# Patient Record
Sex: Male | Born: 1980 | Race: White | Hispanic: No | Marital: Single | Smoking: Former smoker
Health system: Southern US, Community
[De-identification: ages and names within clinical notes are randomized; demographics above are authoritative.]

## PROBLEM LIST (undated history)

## (undated) DIAGNOSIS — F909 Attention-deficit hyperactivity disorder, unspecified type: Secondary | ICD-10-CM

---
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
## 2002-09-09 ENCOUNTER — Emergency Department (HOSPITAL_COMMUNITY): Admission: AC | Admit: 2002-09-09 | Discharge: 2002-09-10 | Payer: Self-pay | Admitting: Emergency Medicine

## 2002-09-10 ENCOUNTER — Encounter: Payer: Self-pay | Admitting: Emergency Medicine

## 2004-02-17 ENCOUNTER — Encounter (INDEPENDENT_AMBULATORY_CARE_PROVIDER_SITE_OTHER): Payer: Self-pay | Admitting: *Deleted

## 2004-02-17 ENCOUNTER — Ambulatory Visit (HOSPITAL_COMMUNITY): Admission: RE | Admit: 2004-02-17 | Discharge: 2004-02-17 | Payer: Self-pay | Admitting: Surgery

## 2004-02-17 ENCOUNTER — Ambulatory Visit (HOSPITAL_BASED_OUTPATIENT_CLINIC_OR_DEPARTMENT_OTHER): Admission: RE | Admit: 2004-02-17 | Discharge: 2004-02-17 | Payer: Self-pay | Admitting: Surgery

## 2007-02-24 ENCOUNTER — Emergency Department (HOSPITAL_COMMUNITY): Admission: EM | Admit: 2007-02-24 | Discharge: 2007-02-24 | Payer: Self-pay | Admitting: *Deleted

## 2007-03-01 ENCOUNTER — Emergency Department (HOSPITAL_COMMUNITY): Admission: EM | Admit: 2007-03-01 | Discharge: 2007-03-01 | Payer: Self-pay | Admitting: Emergency Medicine

## 2011-10-21 ENCOUNTER — Emergency Department (HOSPITAL_COMMUNITY)
Admission: EM | Admit: 2011-10-21 | Discharge: 2011-10-21 | Disposition: A | Discharge status: Home / Self Care | Payer: Self-pay | Attending: Emergency Medicine | Admitting: Emergency Medicine

## 2011-10-21 ENCOUNTER — Encounter (HOSPITAL_COMMUNITY): Payer: Self-pay | Admitting: Family Medicine

## 2011-10-21 DIAGNOSIS — W278XXA Contact with other nonpowered hand tool, initial encounter: Secondary | ICD-10-CM | POA: Insufficient documentation

## 2011-10-21 DIAGNOSIS — S61209A Unspecified open wound of unspecified finger without damage to nail, initial encounter: Secondary | ICD-10-CM | POA: Insufficient documentation

## 2011-10-21 NOTE — ED Notes (Addendum)
Per pt sts was helping his friend out doing [employer] and cut finger on razor blade. Pt had about 1/2 inch left hand middle finger. Bleeding controlled.

## 2012-02-20 ENCOUNTER — Emergency Department (HOSPITAL_BASED_OUTPATIENT_CLINIC_OR_DEPARTMENT_OTHER)
Admission: EM | Admit: 2012-02-20 | Discharge: 2012-02-20 | Disposition: A | Discharge status: Home / Self Care | Payer: Self-pay | Attending: Emergency Medicine | Admitting: Emergency Medicine

## 2012-02-20 ENCOUNTER — Emergency Department (HOSPITAL_BASED_OUTPATIENT_CLINIC_OR_DEPARTMENT_OTHER): Payer: Self-pay

## 2012-02-20 ENCOUNTER — Encounter (HOSPITAL_BASED_OUTPATIENT_CLINIC_OR_DEPARTMENT_OTHER): Payer: Self-pay | Admitting: *Deleted

## 2012-02-20 DIAGNOSIS — F17200 Nicotine dependence, unspecified, uncomplicated: Secondary | ICD-10-CM | POA: Insufficient documentation

## 2012-02-20 DIAGNOSIS — K4090 Unilateral inguinal hernia, without obstruction or gangrene, not specified as recurrent: Secondary | ICD-10-CM | POA: Insufficient documentation

## 2012-02-20 DIAGNOSIS — Z79899 Other long term (current) drug therapy: Secondary | ICD-10-CM | POA: Insufficient documentation

## 2012-02-20 DIAGNOSIS — K469 Unspecified abdominal hernia without obstruction or gangrene: Secondary | ICD-10-CM

## 2012-02-20 LAB — URINALYSIS, ROUTINE W REFLEX MICROSCOPIC
Glucose, UA: NEGATIVE mg/dL
Hgb urine dipstick: NEGATIVE
Leukocytes, UA: NEGATIVE
Protein, ur: NEGATIVE mg/dL
Specific Gravity, Urine: 1.029 (ref 1.005–1.030)

## 2012-02-20 NOTE — ED Notes (Signed)
Patient transported to X-ray 

## 2012-02-20 NOTE — ED Provider Notes (Signed)
Medical screening examination/treatment/procedure(s) were performed by non-physician practitioner and as supervising physician I was immediately available for consultation/collaboration.   Rolan Bucco, MD 02/20/12 [phone]

## 2012-02-20 NOTE — ED Notes (Signed)
Pain in right groin area onset Saturday while he was jogging no urinary signs or symptoms bothers him more when standing or walking

## 2012-02-20 NOTE — ED Provider Notes (Signed)
History     CSN: [id number]  Arrival date & time 02/20/12  1215   First MD Initiated Contact with Patient 02/20/12 1226      Chief Complaint  Patient presents with  . Groin Pain    (Consider location/radiation/quality/duration/timing/severity/associated sxs/prior treatment) HPI Comments: Pt states that he developed right sided groin pain without radiation to the testicle, back or abdomen:pt states that the discomfort is worse with standing or exercising:pt denies dysuria or discharge:no n/v/d,fever or  The history is provided by the patient. No language interpreter was used.    History reviewed. No pertinent past medical history.  History reviewed. No pertinent past surgical history.  History reviewed. No pertinent family history.  History  Substance Use Topics  . Smoking status: Current Every Day Smoker  . Smokeless tobacco: Not on file  . Alcohol Use: Yes     Comment: occ      Review of Systems  Constitutional: Negative.   Respiratory: Negative.   Cardiovascular: Negative.     Allergies  Review of patient's allergies indicates no known allergies.  Home Medications   Current Outpatient Rx  Name  Route  Sig  Dispense  Refill  . AMPHETAMINE-DEXTROAMPHETAMINE 30 MG PO TABS   Oral   Take 30 mg by mouth 2 (two) times daily.         . IBUPROFEN 200 MG PO TABS   Oral   Take 200 mg by mouth every 6 (six) hours as needed. For pain         . LORAZEPAM 1 MG PO TABS   Oral   Take 1 mg by mouth at bedtime.           BP 123/72  Pulse 82  Temp 98.2 F (36.8 C) (Oral)  Resp 20  Ht 6\' 1"  (1.854 m)  Wt 200 lb (90.719 kg)  BMI 26.39 kg/m2  SpO2 99%  Physical Exam  Nursing note and vitals reviewed. Constitutional: He is oriented to person, place, and time. He appears well-developed and well-nourished.  HENT:  Head: Normocephalic and atraumatic.  Eyes: Conjunctivae normal and EOM are normal.  Neck: Neck supple.  Cardiovascular: Normal rate and regular  rhythm.   Pulmonary/Chest: Effort normal and breath sounds normal.  Abdominal: Soft. Bowel sounds are normal. There is no tenderness. A hernia is present. Hernia confirmed positive in the right inguinal area.  Genitourinary: Testes normal and penis normal. No penile tenderness.  Musculoskeletal: Normal range of motion.  Neurological: He is alert and oriented to person, place, and time.  Skin: Skin is warm and dry.  Psychiatric: He has a normal mood and affect.    ED Course  Procedures (including critical care time)  Labs Reviewed  URINALYSIS, ROUTINE W REFLEX MICROSCOPIC - Abnormal; Notable for the following:    Color, Urine AMBER (*)  BIOCHEMICALS MAY BE AFFECTED BY COLOR   All other components within normal limits   Dg Abd 1 View  02/20/2012  *RADIOLOGY REPORT*  Clinical Data: Right groin pain.  ABDOMEN - 1 VIEW  Comparison: None  Findings: Rounded calcification in the left side of the anatomic pelvis, likely small phlebolith.  No suspicious calcifications. Nonobstructive bowel gas pattern.  No supine evidence of free air. No organomegaly or acute bony abnormality.  IMPRESSION: No acute findings.   Original Report Authenticated By: Charlett Nose, M.D.      1. Hernia       MDM  Pt has a reducible hernia:will refer to surgery  Teressa Lower, NP 02/20/12 1321

## 2013-06-28 ENCOUNTER — Encounter (HOSPITAL_COMMUNITY): Payer: Self-pay | Admitting: Emergency Medicine

## 2013-06-28 ENCOUNTER — Emergency Department (HOSPITAL_COMMUNITY)
Admission: EM | Admit: 2013-06-28 | Discharge: 2013-06-29 | Disposition: A | Discharge status: Home / Self Care | Payer: Self-pay | Attending: Emergency Medicine | Admitting: Emergency Medicine

## 2013-06-28 DIAGNOSIS — T401X4A Poisoning by heroin, undetermined, initial encounter: Secondary | ICD-10-CM | POA: Insufficient documentation

## 2013-06-28 DIAGNOSIS — F1210 Cannabis abuse, uncomplicated: Secondary | ICD-10-CM | POA: Insufficient documentation

## 2013-06-28 DIAGNOSIS — Z79899 Other long term (current) drug therapy: Secondary | ICD-10-CM | POA: Insufficient documentation

## 2013-06-28 DIAGNOSIS — F17200 Nicotine dependence, unspecified, uncomplicated: Secondary | ICD-10-CM | POA: Insufficient documentation

## 2013-06-28 DIAGNOSIS — T401X1A Poisoning by heroin, accidental (unintentional), initial encounter: Secondary | ICD-10-CM | POA: Insufficient documentation

## 2013-06-28 DIAGNOSIS — T50901A Poisoning by unspecified drugs, medicaments and biological substances, accidental (unintentional), initial encounter: Secondary | ICD-10-CM

## 2013-06-28 DIAGNOSIS — F1110 Opioid abuse, uncomplicated: Secondary | ICD-10-CM | POA: Insufficient documentation

## 2013-06-28 DIAGNOSIS — Y929 Unspecified place or not applicable: Secondary | ICD-10-CM | POA: Insufficient documentation

## 2013-06-28 DIAGNOSIS — Y939 Activity, unspecified: Secondary | ICD-10-CM | POA: Insufficient documentation

## 2013-06-28 LAB — COMPREHENSIVE METABOLIC PANEL
ALBUMIN: 3.5 g/dL (ref 3.5–5.2)
ALT: 50 U/L (ref 0–53)
AST: 47 U/L — ABNORMAL HIGH (ref 0–37)
Alkaline Phosphatase: 74 U/L (ref 39–117)
BUN: 15 mg/dL (ref 6–23)
CALCIUM: 8.6 mg/dL (ref 8.4–10.5)
CO2: 25 mEq/L (ref 19–32)
CREATININE: 0.86 mg/dL (ref 0.50–1.35)
Chloride: 101 mEq/L (ref 96–112)
GFR calc non Af Amer: >90 mL/min (ref >90)
Glucose, Bld: 84 mg/dL (ref 70–99)
POTASSIUM: 3.9 meq/L (ref 3.7–5.3)
Sodium: 138 mEq/L (ref 137–147)
TOTAL PROTEIN: 6.4 g/dL (ref 6.0–8.3)
Total Bilirubin: 0.6 mg/dL (ref 0.3–1.2)

## 2013-06-28 LAB — CBC WITH DIFFERENTIAL/PLATELET
BASOS PCT: 0 % (ref 0–1)
Basophils Absolute: 0 10*3/uL (ref 0.0–0.1)
EOS ABS: 0.1 10*3/uL (ref 0.0–0.7)
EOS PCT: 1 % (ref 0–5)
HCT: 38.4 % — ABNORMAL LOW (ref 39.0–52.0)
HEMOGLOBIN: 12.9 g/dL — AB (ref 13.0–17.0)
Lymphocytes Relative: 12 % (ref 12–46)
Lymphs Abs: 0.7 10*3/uL (ref 0.7–4.0)
MCH: 28.4 pg (ref 26.0–34.0)
MCHC: 33.6 g/dL (ref 30.0–36.0)
MCV: 84.6 fL (ref 78.0–100.0)
MONOS PCT: 13 % — AB (ref 3–12)
Monocytes Absolute: 0.7 10*3/uL (ref 0.1–1.0)
NEUTROS PCT: 74 % (ref 43–77)
Neutro Abs: 4.4 10*3/uL (ref 1.7–7.7)
Platelets: 149 10*3/uL — ABNORMAL LOW (ref 150–400)
RBC: 4.54 MIL/uL (ref 4.22–5.81)
RDW: 13.8 % (ref 11.5–15.5)
WBC: 5.9 10*3/uL (ref 4.0–10.5)

## 2013-06-28 LAB — RAPID URINE DRUG SCREEN, HOSP PERFORMED
AMPHETAMINES: NOT DETECTED
BENZODIAZEPINES: NOT DETECTED
Barbiturates: NOT DETECTED
COCAINE: NOT DETECTED
OPIATES: POSITIVE — AB
TETRAHYDROCANNABINOL: POSITIVE — AB

## 2013-06-28 NOTE — ED Notes (Addendum)
Per EMS: Pt was found unresponsive by his dad. Patient was found with empty bags that did contain heroin, pt currently admits to use of heroin. EMS established a nasal trumpet and bagged patient. 2 mg of narcan given intranasally and 2 mg of narcan given IV. Nasal trumpet was removed before the arrival of the transport team. Pt is now A/O and maintaining oxygen saturations that are WDL. Pt denies SI/HI. Pt reports using marijuana last week, however denies other drug or alcohol use.

## 2013-06-28 NOTE — ED Notes (Signed)
Pt is calling his sister to come pick him up.

## 2013-06-28 NOTE — ED Provider Notes (Signed)
CSN: [id number]     Arrival date & time 06/28/13  2045 History   First MD Initiated Contact with Patient 06/28/13 2052     Chief Complaint  Patient presents with  . Drug Overdose     (Consider location/radiation/quality/duration/timing/severity/associated sxs/prior Treatment) Patient is a 33 y.o. male presenting with Overdose. The history is provided by the patient and the EMS personnel (the pt took heroin iv and was found by father unresponsive).  Drug Overdose This is a new problem. The current episode started 1 to 2 hours ago. The problem occurs rarely. The problem has been resolved. Pertinent negatives include no chest pain, no abdominal pain and no headaches. Nothing aggravates the symptoms. Relieved by: narcan.    History reviewed. No pertinent past medical history. History reviewed. No pertinent past surgical history. No family history on file. History  Substance Use Topics  . Smoking status: Current Every Day Smoker  . Smokeless tobacco: Not on file  . Alcohol Use: Yes     Comment: occ    Review of Systems  Constitutional: Negative for appetite change and fatigue.  HENT: Negative for congestion, ear discharge and sinus pressure.   Eyes: Negative for discharge.  Respiratory: Negative for cough.   Cardiovascular: Negative for chest pain.  Gastrointestinal: Negative for abdominal pain and diarrhea.  Genitourinary: Negative for frequency and hematuria.  Musculoskeletal: Negative for back pain.  Skin: Negative for rash.  Neurological: Negative for seizures and headaches.  Psychiatric/Behavioral: Negative for hallucinations.      Allergies  Review of patient's allergies indicates no known allergies.  Home Medications   Prior to Admission medications   Medication Sig Start Date End Date Taking? Authorizing Provider  amphetamine-dextroamphetamine (ADDERALL) 30 MG tablet Take 30 mg by mouth 2 (two) times daily.   Yes Historical Provider, MD  clonazePAM (KLONOPIN) 1 MG  tablet Take 1 mg by mouth at bedtime.   Yes Historical Provider, MD  ibuprofen (ADVIL,MOTRIN) 200 MG tablet Take 200 mg by mouth every 6 (six) hours as needed. For pain   Yes Historical Provider, MD   BP 124/64  Pulse 70  Temp(Src) 97.9 F (36.6 C) (Oral)  Resp 16  SpO2 98% Physical Exam  Constitutional: He is oriented to person, place, and time. He appears well-developed.  HENT:  Head: Normocephalic.  Eyes: Conjunctivae and EOM are normal. No scleral icterus.  Neck: Neck supple. No thyromegaly present.  Cardiovascular: Normal rate and regular rhythm.  Exam reveals no gallop and no friction rub.   No murmur heard. Pulmonary/Chest: No stridor. He has no wheezes. He has no rales. He exhibits no tenderness.  Abdominal: He exhibits no distension. There is no tenderness. There is no rebound.  Musculoskeletal: Normal range of motion. He exhibits no edema.  Lymphadenopathy:    He has no cervical adenopathy.  Neurological: He is oriented to person, place, and time. He exhibits normal muscle tone. Coordination normal.  Skin: No rash noted. No erythema.  Psychiatric: He has a normal mood and affect. His behavior is normal.    ED Course  Procedures (including critical care time) Labs Review Labs Reviewed  CBC WITH DIFFERENTIAL - Abnormal; Notable for the following:    Hemoglobin 12.9 (*)    HCT 38.4 (*)    Platelets 149 (*)    Monocytes Relative 13 (*)    All other components within normal limits  COMPREHENSIVE METABOLIC PANEL - Abnormal; Notable for the following:    AST 47 (*)    All other  components within normal limits  URINE RAPID DRUG SCREEN (HOSP PERFORMED) - Abnormal; Notable for the following:    Opiates POSITIVE (*)    Tetrahydrocannabinol POSITIVE (*)    All other components within normal limits    Imaging Review No results found.   EKG Interpretation None      MDM   Final diagnoses:  Drug overdose    Pt refused in pt tx.  He is not suicidal.  He states he  will follow up with out pt tx this week.   Benny Lennert, MD 06/28/13 2252

## 2013-06-28 NOTE — Discharge Instructions (Signed)
Follow up this week with out pt treatment as you had planned.

## 2013-06-29 NOTE — ED Notes (Signed)
Resources for substance abuse facilities given to pt

## 2013-12-01 ENCOUNTER — Encounter (HOSPITAL_COMMUNITY): Payer: Self-pay | Admitting: Emergency Medicine

## 2013-12-01 ENCOUNTER — Emergency Department (HOSPITAL_COMMUNITY)
Admission: EM | Admit: 2013-12-01 | Discharge: 2013-12-01 | Disposition: A | Discharge status: Home / Self Care | Payer: Self-pay | Attending: Emergency Medicine | Admitting: Emergency Medicine

## 2013-12-01 DIAGNOSIS — F1110 Opioid abuse, uncomplicated: Secondary | ICD-10-CM | POA: Insufficient documentation

## 2013-12-01 DIAGNOSIS — Z79899 Other long term (current) drug therapy: Secondary | ICD-10-CM | POA: Insufficient documentation

## 2013-12-01 DIAGNOSIS — F1910 Other psychoactive substance abuse, uncomplicated: Secondary | ICD-10-CM

## 2013-12-01 DIAGNOSIS — Z720 Tobacco use: Secondary | ICD-10-CM | POA: Insufficient documentation

## 2013-12-01 DIAGNOSIS — F909 Attention-deficit hyperactivity disorder, unspecified type: Secondary | ICD-10-CM | POA: Insufficient documentation

## 2013-12-01 HISTORY — DX: Attention-deficit hyperactivity disorder, unspecified type: F90.9

## 2013-12-01 NOTE — ED Notes (Signed)
PT states last used heroin on Saturday. Went to [facility] on Monday for detox. Was discharged last night and needs medical clearance and help getting into [facility]. Denies withdrawal symptoms except anxiety. A&Ox3; denies HI or SI.

## 2013-12-01 NOTE — ED Notes (Signed)
Pt states he would like to get into [facility] to be treated for opiate abuse.  Pt was just discharged from [facility] for opiate detox.  Denies SI or HI ideation.

## 2013-12-01 NOTE — Discharge Instructions (Signed)
[facility]

## 2013-12-01 NOTE — ED Provider Notes (Signed)
CSN: [id number]     Arrival date & time 12/01/13  [id number] History   First MD Initiated Contact with Patient 12/01/13 0740     Chief Complaint  Patient presents with  . Medical Clearance     (Consider location/radiation/quality/duration/timing/severity/associated sxs/prior Treatment) HPI [facility]. 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 He last used heroin and marijuana 9 days ago. No further drug use since. He is presently asymptomatic. Past Medical History  Diagnosis Date  . ADHD (attention deficit hyperactivity disorder)    History reviewed. No pertinent past surgical history. No family history on file. History  Substance Use Topics  . Smoking status: Current Every Day Smoker -- 1.00 packs/day    Types: Cigarettes  . Smokeless tobacco: Not on file  . Alcohol Use: Yes     Comment: occ   denies recent alcohol use  IV drug use last time 9 days ago Review of Systems  Constitutional: Negative.   HENT: Negative.   Respiratory: Negative.   Cardiovascular: Negative.   Gastrointestinal: Negative.   Musculoskeletal: Negative.   Skin: Negative.   Neurological: Negative.   Psychiatric/Behavioral: Negative.   All other systems reviewed and are negative.     Allergies  Review of patient's allergies indicates no known allergies.  Home Medications   Prior to Admission medications   Medication Sig Start Date End Date Taking? Authorizing Provider  amphetamine-dextroamphetamine (ADDERALL) 30 MG tablet Take 30 mg by mouth 2 (two) times daily.   Yes Historical Provider, MD   BP 122/81  Pulse 80  Temp(Src) 97.5 F (36.4 C) (Oral)  Resp 20  Ht 6\' 1"  (1.854 m)  Wt 180 lb (81.647 kg)  BMI 23.75 kg/m2  SpO2 97% Physical Exam  Nursing note and vitals reviewed. Constitutional: He is oriented to person, place, and time. He appears well-developed and well-nourished.   HENT:  Head: Normocephalic and atraumatic.  Eyes: Conjunctivae are normal. Pupils are equal, round, and reactive to light.  Neck: Neck supple. No tracheal deviation present. No thyromegaly present.  Cardiovascular: Normal rate and regular rhythm.   No murmur heard. Pulmonary/Chest: Effort normal and breath sounds normal.  Abdominal: Soft. Bowel sounds are normal. He exhibits no distension. There is no tenderness.  Musculoskeletal: Normal range of motion. He exhibits no edema and no tenderness.  Neurological: He is alert and oriented to person, place, and time. No cranial nerve deficit. Coordination normal.  Gait normal and steady  Skin: Skin is warm and dry. No rash noted.  Psychiatric: He has a normal mood and affect.    ED Course  Procedures (including critical care time) Labs Review Labs Reviewed - No data to display  Imaging Review No results found.   EKG Interpretation None      MDM  I spoke with Arnoldo MoraleNancy Ratlidge,, [facility]. No further ED evaluation needed. Patient can present directly to [facility] upon leaving here for evaluation for drug treatment Final diagnoses:  None        Doug SouSam Haden Suder, MD 12/01/13 [phone]

## 2014-08-14 ENCOUNTER — Emergency Department (HOSPITAL_COMMUNITY): Payer: Self-pay

## 2014-08-14 ENCOUNTER — Emergency Department (HOSPITAL_COMMUNITY)
Admission: EM | Admit: 2014-08-14 | Discharge: 2014-08-15 | Disposition: A | Discharge status: Home / Self Care | Payer: Self-pay | Attending: Emergency Medicine | Admitting: Emergency Medicine

## 2014-08-14 ENCOUNTER — Encounter (HOSPITAL_COMMUNITY): Payer: Self-pay | Admitting: Emergency Medicine

## 2014-08-14 DIAGNOSIS — Z720 Tobacco use: Secondary | ICD-10-CM | POA: Insufficient documentation

## 2014-08-14 DIAGNOSIS — Y9389 Activity, other specified: Secondary | ICD-10-CM | POA: Insufficient documentation

## 2014-08-14 DIAGNOSIS — W01198A Fall on same level from slipping, tripping and stumbling with subsequent striking against other object, initial encounter: Secondary | ICD-10-CM | POA: Insufficient documentation

## 2014-08-14 DIAGNOSIS — S61511A Laceration without foreign body of right wrist, initial encounter: Secondary | ICD-10-CM | POA: Insufficient documentation

## 2014-08-14 DIAGNOSIS — Y9289 Other specified places as the place of occurrence of the external cause: Secondary | ICD-10-CM | POA: Insufficient documentation

## 2014-08-14 DIAGNOSIS — Z79899 Other long term (current) drug therapy: Secondary | ICD-10-CM | POA: Insufficient documentation

## 2014-08-14 DIAGNOSIS — Y998 Other external cause status: Secondary | ICD-10-CM | POA: Insufficient documentation

## 2014-08-14 DIAGNOSIS — W25XXXA Contact with sharp glass, initial encounter: Secondary | ICD-10-CM | POA: Insufficient documentation

## 2014-08-14 DIAGNOSIS — Z23 Encounter for immunization: Secondary | ICD-10-CM | POA: Insufficient documentation

## 2014-08-14 DIAGNOSIS — F909 Attention-deficit hyperactivity disorder, unspecified type: Secondary | ICD-10-CM | POA: Insufficient documentation

## 2014-08-14 MED ORDER — LIDOCAINE HCL (PF) 2 % IJ SOLN
INTRAMUSCULAR | Status: AC
  Filled 2014-08-14: qty 10

## 2014-08-14 MED ORDER — ONDANSETRON HCL 4 MG PO TABS
4.0000 mg | ORAL_TABLET | Freq: Once | ORAL | Status: AC
  Administered 2014-08-14: 4 mg via ORAL
  Filled 2014-08-14: qty 1

## 2014-08-14 MED ORDER — TETANUS-DIPHTH-ACELL PERTUSSIS 5-2.5-18.5 LF-MCG/0.5 IM SUSP
0.5000 mL | Freq: Once | INTRAMUSCULAR | Status: AC
  Administered 2014-08-14: 0.5 mL via INTRAMUSCULAR
  Filled 2014-08-14: qty 0.5

## 2014-08-14 MED ORDER — MELOXICAM 15 MG PO TABS: 15.0000 mg | ORAL_TABLET | Freq: Every day | ORAL | Status: DC

## 2014-08-14 MED ORDER — KETOROLAC TROMETHAMINE 10 MG PO TABS
10.0000 mg | ORAL_TABLET | Freq: Once | ORAL | Status: AC
  Administered 2014-08-14: 10 mg via ORAL
  Filled 2014-08-14: qty 1

## 2014-08-14 NOTE — ED Notes (Signed)
Pt upset and pacing his room. Pt came out and partialy into nurses station. Pt was asked to back up and to go back to his room. Pt yelling out that he needed pain medicine. EDPA made aware and pt was notified. No orders received at this time.

## 2014-08-14 NOTE — ED Notes (Signed)
Patient reports cut right wrist/arm on a glass table tonight. States fell and landed on glass table and right arm went through glass table.

## 2014-08-14 NOTE — ED Provider Notes (Signed)
CSN: [id number]     Arrival date & time 08/14/14  2117 History   First MD Initiated Contact with Patient 08/14/14 2125     Chief Complaint  Patient presents with  . Extremity Laceration     (Consider location/radiation/quality/duration/timing/severity/associated sxs/prior Treatment) HPI Comments: Patient is a 34 year old male who presents to the emergency department with laceration to the wrists, and multiple lacerations of the upper extremities.  The patient states that he fell, and his hands went through a glass table tonight. The patient noted minor lacerations of the right and left upper extremities. But the main laceration was the right wrist. The patient had some bleeding still present, but controlled with pressure. The patient denies being on any anticoagulation medications. The patient patient has not had any operations or procedures involving the right hand or wrist. The patient has not taken anything for discomfort up to this point.  The history is provided by the patient.    Past Medical History  Diagnosis Date  . ADHD (attention deficit hyperactivity disorder)    History reviewed. No pertinent past surgical history. History reviewed. No pertinent family history. History  Substance Use Topics  . Smoking status: Current Every Day Smoker -- 1.00 packs/day    Types: Cigarettes  . Smokeless tobacco: Not on file  . Alcohol Use: Yes     Comment: occ    Review of Systems  Constitutional: Negative for activity change.       All ROS Neg except as noted in HPI  HENT: Negative for nosebleeds.   Eyes: Negative for photophobia and discharge.  Respiratory: Negative for cough, shortness of breath and wheezing.   Cardiovascular: Negative for chest pain and palpitations.  Gastrointestinal: Negative for abdominal pain and blood in stool.  Genitourinary: Negative for dysuria, frequency and hematuria.  Musculoskeletal: Negative for back pain, arthralgias and neck pain.  Skin:  Negative.   Neurological: Negative for dizziness, seizures and speech difficulty.  Psychiatric/Behavioral: Negative for hallucinations and confusion.      Allergies  Review of patient's allergies indicates no known allergies.  Home Medications   Prior to Admission medications   Medication Sig Start Date End Date Taking? Authorizing Provider  amphetamine-dextroamphetamine (ADDERALL) 30 MG tablet Take 30 mg by mouth 2 (two) times daily.   Yes Historical Provider, MD   BP 122/71 mmHg  Pulse 91  Temp(Src) 98.6 F (37 C) (Oral)  Resp 16  SpO2 98% Physical Exam  Constitutional: He is oriented to person, place, and time. He appears well-developed and well-nourished.  Non-toxic appearance.  HENT:  Head: Normocephalic.  Right Ear: Tympanic membrane and external ear normal.  Left Ear: Tympanic membrane and external ear normal.  Eyes: EOM and lids are normal. Pupils are equal, round, and reactive to light.  Neck: Normal range of motion. Neck supple. Carotid bruit is not present.  Cardiovascular: Normal rate, regular rhythm, normal heart sounds, intact distal pulses and normal pulses.   Pulmonary/Chest: Breath sounds normal. No respiratory distress.  Abdominal: Soft. Bowel sounds are normal. There is no tenderness. There is no guarding.  Musculoskeletal: Normal range of motion.  There is a shallow laceration to the right upper bicep tricep area. There are abrasions to the palmar surface of the fifth metacarpal on the right. There is a shallow abrasion of the palmar surface of the left hand. Bleeding is controlled in these areas. There are shallow lacerations and abrasions of the palmar surface and dorsum of the right hand. There is a 2 cm  laceration of the palmar surface of the right wrist. There is no bone or tendon involvement. No palpable or visible foreign body appreciated.  Lymphadenopathy:       Head (right side): No submandibular adenopathy present.       Head (left side): No  submandibular adenopathy present.    He has no cervical adenopathy.  Neurological: He is alert and oriented to person, place, and time. He has normal strength. No cranial nerve deficit or sensory deficit.  No motor or sensory deficits noted of the upper extremities. Patient is ambulatory without problem.  Skin: Skin is warm and dry.  Psychiatric: He has a normal mood and affect. His speech is normal.  Nursing note and vitals reviewed.   ED Course  LACERATION REPAIR Date/Time: 08/14/2014 11:34 PM Performed by: Ivery Quale Authorized by: Ivery Quale Consent: Verbal consent obtained. Risks and benefits: risks, benefits and alternatives were discussed Consent given by: patient Patient understanding: patient states understanding of the procedure being performed Patient identity confirmed: arm band Time out: Immediately prior to procedure a "time out" was called to verify the correct patient, procedure, equipment, support staff and site/side marked as required. Body area: upper extremity Location details: right wrist Laceration length: 2 cm Foreign bodies: no foreign bodies Tendon involvement: none Vascular damage: no Anesthesia: local infiltration Local anesthetic: lidocaine 2% without epinephrine Patient sedated: no Preparation: Patient was prepped and draped in the usual sterile fashion. Irrigation solution: saline Amount of cleaning: standard Debridement: none Skin closure: 4-0 Prolene Number of sutures: 4 Technique: simple Approximation: close Approximation difficulty: simple Dressing: gauze roll Patient tolerance: Patient tolerated the procedure well with no immediate complications   (including critical care time) Labs Review Labs Reviewed - No data to display  Imaging Review No results found.   EKG Interpretation None      MDM  Vital signs are well within normal limits. Pulse oximetry is 98% on room air. Within normal limits by my interpretation. The  patient had an x-ray of the right forearm. There is no fracture, dislocation, and no foreign body appreciated on x-ray.  The wound was repaired with 4 interrupted sutures of 4-0 proline without problem. Patient to have sutures removed in 7 days. Discussed with the patient importance of keeping the wound clean and dry, and to see his primary physician or return to the emergency department if any signs of infection. Patient requesting medication for pain and soreness. Patient prescribed by Mobic 50 mg daily.    Final diagnoses:  None    **I have reviewed nursing notes, vital signs, and all appropriate lab and imaging results for this patient.Ivery Quale, PA-C 08/14/14 1610  Margarita Grizzle, MD 08/15/14 [phone]

## 2014-08-14 NOTE — Discharge Instructions (Signed)
Please keep the sutured wound clean and dry. Have sutures removed in 7 days. Stitches, Staples, or Skin Adhesive Strips  Stitches (sutures), staples, and skin adhesive strips hold the skin together as it heals. They will usually be in place for 7 days or less. HOME CARE  Wash your hands with soap and water before and after you touch your wound.  Only take medicine as told by your doctor.  Cover your wound only if your doctor told you to. Otherwise, leave it open to air.  Do not get your stitches wet or dirty. If they get dirty, dab them gently with a clean washcloth. Wet the washcloth with soapy water. Do not rub. Pat them dry gently.  Do not put medicine or medicated cream on your stitches unless your doctor told you to.  Do not take out your own stitches or staples. Skin adhesive strips will fall off by themselves.  Do not pick at the wound. Picking can cause an infection.  Do not miss your follow-up appointment.  If you have problems or questions, call your doctor. GET HELP RIGHT AWAY IF:   You have a temperature by mouth above 102 F (38.9 C), not controlled by medicine.  You have chills.  You have redness or pain around your stitches.  There is puffiness (swelling) around your stitches.  You notice fluid (drainage) from your stitches.  There is a bad smell coming from your wound. MAKE SURE YOU:  Understand these instructions.  Will watch your condition.  Will get help if you are not doing well or get worse. Document Released: 11/20/2008 Document Revised: 04/17/2011 Document Reviewed: 11/20/2008 [facility] Patient Information 2015 [employer] [place]. This information is not intended to replace advice given to you by your health care provider. Make sure you discuss any questions you have with your health care provider.

## 2014-10-03 ENCOUNTER — Emergency Department (HOSPITAL_COMMUNITY): Payer: Self-pay

## 2014-10-03 ENCOUNTER — Other Ambulatory Visit: Payer: Self-pay

## 2014-10-03 ENCOUNTER — Emergency Department (HOSPITAL_COMMUNITY)
Admission: EM | Admit: 2014-10-03 | Discharge: 2014-10-03 | Disposition: A | Discharge status: Home / Self Care | Payer: Self-pay | Attending: Emergency Medicine | Admitting: Emergency Medicine

## 2014-10-03 DIAGNOSIS — S60512A Abrasion of left hand, initial encounter: Secondary | ICD-10-CM | POA: Insufficient documentation

## 2014-10-03 DIAGNOSIS — S028XXA Fractures of other specified skull and facial bones, initial encounter for closed fracture: Secondary | ICD-10-CM | POA: Insufficient documentation

## 2014-10-03 DIAGNOSIS — F1510 Other stimulant abuse, uncomplicated: Secondary | ICD-10-CM | POA: Insufficient documentation

## 2014-10-03 DIAGNOSIS — IMO0002 Reserved for concepts with insufficient information to code with codable children: Secondary | ICD-10-CM

## 2014-10-03 DIAGNOSIS — S60812A Abrasion of left wrist, initial encounter: Secondary | ICD-10-CM | POA: Insufficient documentation

## 2014-10-03 DIAGNOSIS — S51012A Laceration without foreign body of left elbow, initial encounter: Secondary | ICD-10-CM | POA: Insufficient documentation

## 2014-10-03 DIAGNOSIS — F1310 Sedative, hypnotic or anxiolytic abuse, uncomplicated: Secondary | ICD-10-CM | POA: Insufficient documentation

## 2014-10-03 DIAGNOSIS — S0291XA Unspecified fracture of skull, initial encounter for closed fracture: Secondary | ICD-10-CM

## 2014-10-03 DIAGNOSIS — S90811A Abrasion, right foot, initial encounter: Secondary | ICD-10-CM | POA: Insufficient documentation

## 2014-10-03 DIAGNOSIS — S80811A Abrasion, right lower leg, initial encounter: Secondary | ICD-10-CM | POA: Insufficient documentation

## 2014-10-03 DIAGNOSIS — S80812A Abrasion, left lower leg, initial encounter: Secondary | ICD-10-CM | POA: Insufficient documentation

## 2014-10-03 DIAGNOSIS — F1410 Cocaine abuse, uncomplicated: Secondary | ICD-10-CM | POA: Insufficient documentation

## 2014-10-03 DIAGNOSIS — F1210 Cannabis abuse, uncomplicated: Secondary | ICD-10-CM | POA: Insufficient documentation

## 2014-10-03 DIAGNOSIS — S41032A Puncture wound without foreign body of left shoulder, initial encounter: Secondary | ICD-10-CM | POA: Insufficient documentation

## 2014-10-03 DIAGNOSIS — Y9389 Activity, other specified: Secondary | ICD-10-CM | POA: Insufficient documentation

## 2014-10-03 DIAGNOSIS — Z79899 Other long term (current) drug therapy: Secondary | ICD-10-CM | POA: Insufficient documentation

## 2014-10-03 DIAGNOSIS — T07XXXA Unspecified multiple injuries, initial encounter: Secondary | ICD-10-CM

## 2014-10-03 DIAGNOSIS — Y998 Other external cause status: Secondary | ICD-10-CM | POA: Insufficient documentation

## 2014-10-03 DIAGNOSIS — F1110 Opioid abuse, uncomplicated: Secondary | ICD-10-CM | POA: Insufficient documentation

## 2014-10-03 DIAGNOSIS — S060X1A Concussion with loss of consciousness of 30 minutes or less, initial encounter: Secondary | ICD-10-CM | POA: Insufficient documentation

## 2014-10-03 DIAGNOSIS — Y92410 Unspecified street and highway as the place of occurrence of the external cause: Secondary | ICD-10-CM | POA: Insufficient documentation

## 2014-10-03 DIAGNOSIS — S0081XA Abrasion of other part of head, initial encounter: Secondary | ICD-10-CM | POA: Insufficient documentation

## 2014-10-03 DIAGNOSIS — S60511A Abrasion of right hand, initial encounter: Secondary | ICD-10-CM | POA: Insufficient documentation

## 2014-10-03 DIAGNOSIS — S90812A Abrasion, left foot, initial encounter: Secondary | ICD-10-CM | POA: Insufficient documentation

## 2014-10-03 LAB — RAPID URINE DRUG SCREEN, HOSP PERFORMED
Amphetamines: POSITIVE — AB
Barbiturates: NOT DETECTED
Benzodiazepines: POSITIVE — AB
COCAINE: POSITIVE — AB
OPIATES: POSITIVE — AB
TETRAHYDROCANNABINOL: POSITIVE — AB

## 2014-10-03 LAB — CBC
HEMATOCRIT: 45.2 % (ref 39.0–52.0)
Hemoglobin: 15.4 g/dL (ref 13.0–17.0)
MCH: 30.5 pg (ref 26.0–34.0)
MCHC: 34.1 g/dL (ref 30.0–36.0)
MCV: 89.5 fL (ref 78.0–100.0)
PLATELETS: 197 10*3/uL (ref 150–400)
RBC: 5.05 MIL/uL (ref 4.22–5.81)
RDW: 13 % (ref 11.5–15.5)
WBC: 10.2 10*3/uL (ref 4.0–10.5)

## 2014-10-03 LAB — COMPREHENSIVE METABOLIC PANEL
ALT: 12 U/L — AB (ref 17–63)
AST: 22 U/L (ref 15–41)
Albumin: 4 g/dL (ref 3.5–5.0)
Alkaline Phosphatase: 58 U/L (ref 38–126)
Anion gap: 5 (ref 5–15)
BUN: 15 mg/dL (ref 6–20)
CHLORIDE: 107 mmol/L (ref 101–111)
CO2: 27 mmol/L (ref 22–32)
CREATININE: 1.08 mg/dL (ref 0.61–1.24)
Calcium: 9.2 mg/dL (ref 8.9–10.3)
GFR calc non Af Amer: >60 mL/min (ref >60)
Glucose, Bld: 117 mg/dL — ABNORMAL HIGH (ref 65–99)
POTASSIUM: 3.7 mmol/L (ref 3.5–5.1)
SODIUM: 139 mmol/L (ref 135–145)
Total Bilirubin: 0.7 mg/dL (ref 0.3–1.2)
Total Protein: 6.7 g/dL (ref 6.5–8.1)

## 2014-10-03 LAB — ABO/RH: ABO/RH(D): O POS

## 2014-10-03 LAB — TYPE AND SCREEN
ABO/RH(D): O POS
Antibody Screen: NEGATIVE

## 2014-10-03 LAB — PROTIME-INR
INR: 1.02 (ref 0.00–1.49)
Prothrombin Time: 13.6 seconds (ref 11.6–15.2)

## 2014-10-03 LAB — ETHANOL: Alcohol, Ethyl (B): <5 mg/dL (ref <5)

## 2014-10-03 MED ORDER — TETANUS-DIPHTH-ACELL PERTUSSIS 5-2.5-18.5 LF-MCG/0.5 IM SUSP
0.5000 mL | Freq: Once | INTRAMUSCULAR | Status: AC
  Administered 2014-10-03: 0.5 mL via INTRAMUSCULAR
  Filled 2014-10-03: qty 0.5

## 2014-10-03 MED ORDER — LIDOCAINE HCL (PF) 1 % IJ SOLN
10.0000 mL | Freq: Once | INTRAMUSCULAR | Status: AC
  Administered 2014-10-03: 10 mL
  Filled 2014-10-03: qty 10

## 2014-10-03 MED ORDER — FENTANYL CITRATE (PF) 100 MCG/2ML IJ SOLN: 50.0000 ug | INTRAMUSCULAR | Status: DC | PRN

## 2014-10-03 MED ORDER — CEFAZOLIN SODIUM 1-5 GM-% IV SOLN
1.0000 g | Freq: Once | INTRAVENOUS | Status: AC
  Administered 2014-10-03: 1 g via INTRAVENOUS
  Filled 2014-10-03: qty 50

## 2014-10-03 MED ORDER — CEPHALEXIN 500 MG PO CAPS: 500.0000 mg | ORAL_CAPSULE | Freq: Four times a day (QID) | ORAL | Status: DC

## 2014-10-03 MED ORDER — HYDROCODONE-ACETAMINOPHEN 5-325 MG PO TABS: 1.0000 | ORAL_TABLET | Freq: Four times a day (QID) | ORAL | Status: DC | PRN

## 2014-10-03 MED ORDER — IOHEXOL 300 MG/ML  SOLN
100.0000 mL | Freq: Once | INTRAMUSCULAR | Status: AC | PRN
  Administered 2014-10-03: 100 mL via INTRAVENOUS

## 2014-10-03 MED ORDER — TETANUS-DIPHTH-ACELL PERTUSSIS 5-2.5-18.5 LF-MCG/0.5 IM SUSP: 0.5000 mL | Freq: Once | INTRAMUSCULAR | Status: DC

## 2014-10-03 NOTE — ED Notes (Signed)
Father at bedside Dr. Angela Cox in room talking to family

## 2014-10-03 NOTE — Discharge Instructions (Signed)
°Emergency Department Resource Guide °1) Find a Doctor and Pay Out of Pocket °Although you won't have to find out who is covered by your insurance plan, it is a good idea to ask around and get recommendations. You will then need to call the office and see if the doctor you have chosen will accept you as a new patient and what types of options they offer for patients who are self-pay. Some doctors offer discounts or will set up payment plans for their patients who do not have insurance, but you will need to ask so you aren't surprised when you get to your appointment. ° °2) Contact Your Local Health Department °Not all health departments have doctors that can see patients for sick visits, but many do, so it is worth a call to see if yours does. If you don't know where your local health department is, you can check in your phone book. The CDC also has a tool to help you locate your state's health department, and many state websites also have listings of all of their local health departments. ° °3) Find a Walk-in Clinic °If your illness is not likely to be very severe or complicated, you may want to try a walk in clinic. These are popping up all over the country in pharmacies, drugstores, and shopping centers. They're usually staffed by nurse practitioners or physician assistants that have been trained to treat common illnesses and complaints. They're usually fairly quick and inexpensive. However, if you have serious medical issues or chronic medical problems, these are probably not your best option. ° °No Primary Care Doctor: °- Call [facility] at  832-8000 - they can help you locate a primary care doctor that  accepts your insurance, provides certain services, etc. °- Physician Referral Service- 1-800-533-3463 ° °Chronic Pain Problems: °Organization         Address  Phone   Notes  °[facility]  (336) 297-2271 Patients need to be referred by their primary care doctor.  ° °Medication  Assistance: °Organization         Address  Phone   Notes  °[place] [facility] [address] °[place], [place] [address] (336) 641-8030 --Must be a resident of [place] °-- Must have NO insurance coverage whatsoever (no Medicaid/ Medicare, etc.) °-- The pt. MUST have a primary care doctor that directs their care regularly and follows them in the community °  °[facility]  (866) 331-1348   °[employer]  (888) 892-1162   ° °Agencies that provide inexpensive medical care: °Organization         Address  Phone   Notes  °[facility]  (336) 832-8035   °[facility]    (336) 832-7272   °[facility] [address] °[place], [place] [address] (336) 832-4777   °[facility] [address], °[place] (336) 271-4999   °[facility]    (336) 373-0678   °[facility]    (336) 272-1050   °[facility] ° [address], [place] Phone:  (336) 832-4444, Fax:  (336) 832-4440 Hours of Operation:  9 am - 6 pm, M-F.  Also accepts Medicaid/Medicare and self-pay.  °[facility] ° [address], [place] Phone: (336) 832-3150, Fax: (336) 832-3151. Hours of Operation:  8:30 am - 5:30 pm, M-F.  Also accepts Medicaid and self-pay.  °[facility] [place] [address], [place] Phone: (336) 878-6027   °[facility] [address], [place], [place] (336)723-1848, Ext. 123 Mondays & Thursdays: 7-9 AM.  First 15 patients are seen on a first come, first serve basis. °  ° °Medicaid-accepting [place] Providers: ° °Organization         Address  Phone   Notes  °[facility] [address], [place] (336) 641-2100 Also accepts self-pay patients.  °[facility] [address], [place] ° (336) 856-9996   °[facility] [address], [place]  (336) 288-8857   °[facility] [address] [place] [address], [place] (336) 299-7000   °Veita Bland [address], [place]  ° (336) 373-1557 Only accepts [place] Access Medicaid patients after they have their name applied to their card.  ° °Self-Pay (no insurance) in [place]: ° °Organization         Address  Phone   Notes  °Sickle Cell Patients, [facility] [address], [place] (336) 832-1970   °[facility] [address], [place] (336) 832-4400   °[facility] [place] ° [address],  (336) 992-4800   °[facility]/Dr. Osei-Bonsu ° [address] [place] [address], [place] or [address], [place] (336) 841-8500 Phone number for both [place] and [place] locations is the same.  °[facility] [address], [place] (336) 299-0000   °[facility] [address] [place] [address], [place] or [address] (336) 852-7530 °(336) 878-2260   °[facility] [address], [place] (336) 350-1642, phone; (336) 294-5005, fax Sees patients 1st and 3rd Saturday of every month.  Must not qualify for public or private insurance (i.e. Medicaid, Medicare, [place] Health Choice, Veterans' Benefits) • Household income should be no more than 200% of the poverty level •The clinic cannot treat you if you are pregnant or think you are pregnant • Sexually transmitted diseases are not treated at the clinic.  ° ° °Dental Care: °Organization         Address  Phone  Notes  °[place] [facility] [address], [place] (336) 641-6152 Accepts children up to age 21 who are enrolled in Medicaid or [place] Health Choice; pregnant women with a Medicaid card; and children who have applied for Medicaid or [place] Health Choice, but were declined, whose parents can pay a reduced fee at time of service.  °[place]  [facility] [place]  [address], [place] (336) 641-7733 Accepts children up to age 21 who are enrolled in Medicaid or [place] Health Choice; pregnant women with a Medicaid card; and children who have applied for Medicaid or [place] Health Choice, but were declined, whose parents can pay a reduced fee at time of service.  °[facility] ° [address],  (336) 641-4533 Patients are seen by appointment only. Walk-ins are not accepted. [facility] will see patients 18 years of age and older. °Monday - Tuesday (8am-5pm) °Most Wednesdays (8:30-5pm) °$30 per visit, cash only  °[facility] ° [address], [place] (336) 641-4533 Patients are seen by appointment only. Walk-ins are not accepted. [facility] will see patients 18 years of age and older. °One   Wednesday Evening (Monthly: Volunteer Based).  $30 per visit, cash only  °[facility]  (919) 537-3737 for adults; Children under age 4, call Graduate Pediatric Dentistry at ([phone]) [phone]. Children aged 4-14, please call (919) [phone] to request a pediatric application. ° Dental services are provided in all areas of dental care including fillings, crowns and bridges, complete and partial dentures, implants, gum treatment, root canals, and extractions. Preventive care is also provided. Treatment is provided to both adults and children. °Patients are selected via a lottery and there is often a waiting list. °  °[facility] [address], °[place] ° (336) 763-8833 [email or web address] °  °[facility] [address], [place], [place] (336)723-1848, Ext. 123 Second and Fourth Thursday of each month, opens at 6:30 AM; Clinic ends at 9 AM.  Patients are seen on a first-come first-served basis, and a limited number are seen during each clinic.  ° °[facility] ° [address], [place], [place] (336) 723-7904    Eligibility Requirements °You must have lived in [place], [place], or [place] counties for at least the last three months. °  You cannot be eligible for state or federal sponsored healthcare insurance, including Veterans Administration, Medicaid, or Medicare. °  You generally cannot be eligible for healthcare insurance through your employer.  °  How to apply: °Eligibility screenings are held every Tuesday and Wednesday afternoon from 1:00 pm until 4:00 pm. You do not need an appointment for the interview!  °[facility] [address], [place], [place] 336-631-2330   °[facility]  336-342-8273   °[facility]  336-703-3100   °[facility]  336-570-6415   ° °Behavioral Health Resources in the Community: °Intensive Outpatient Programs °Organization         Address  Phone  Notes  °[facility] [address], [place], [place] 336-878-6098   °[facility] [address], [place], [place] 336-832-9800   °[facility] [address], [place], [place] ° 336-882-2125   °[facility] [address],  °[place], [place] 1-800-853-5163 or 336-641-4981   °Substance Abuse Resources °Organization         Address  Phone  Notes  °[facility]  336-882-2125   °[facility]  336-784-9470   °[facility]  336-285-9073   °[facility]  336-845-3988   °Residential & Outpatient Substance Abuse Program  1-800-659-3381   °Psychological Services °Organization         Address  Phone  Notes  ° Health  336- 832-9600   °[facility]  336- 378-7881   °[facility] [address] 1-800-853-5163 or 336-641-4981   ° °Mobile Crisis Teams °Organization         Address  Phone  Notes  °[facility], Mobile Crisis Care Unit  1-877-626-1772   °Assertive °[facility] ° [address]  [place], [place] 336-834-9664   °Sharon DeEsch [address] °[place] 336-554-5454   ° °Self-Help/Support Groups °Organization         Address  Phone             Notes  °[facility] - variety of support groups  336- 373-1402 Call for more information  °Narcotics Anonymous (NA), [facility] [address], °[place]  2 meetings at this location  ° °  Residential Treatment Programs °Organization         Address  Phone  Notes  °[facility] [address],    °[place]  1-866-801-8205   °[facility] ° [address], [place], [place] 704-293-8524   °[facility] [address], [place] 336-845-3988 Admissions: 8am-3pm M-F  °[facility] [address]    °[place], [place] 336-841-1104   °[facility] [address], [place], [place] 336-379-7146   °[facility] [address]  °[place], [place] 336-285-9073   °[facility] Intensive Outpatient [address], [place], [place] 336-852-3033   °[facility] [address]  °[place], [place] 1-877-615-2722 or 336-784-9470   °[facility] [address] [place], [place] 336-227-7417 Accepts Medicaid  °[facility] [address]  °[place] 1-800-659-3381 Substance Abuse/Addiction Treatment  ° °[place] Behavioral Health Resources °Organization         Address  Phone  Notes  °[facility]  (888) 581-9988   °Julie Brannon, PhD [address] [place], [place]   (336) 349-5553 or (336) 951-0000   °[facility]   [address] °[place], [place] (336) 349-4454   °[facility] [address], [place], [place] (336) 342-8316 Insurance/Medicaid/sponsorship through [facility]  °[facility] [address]                                    [place], [place] (336) 342-8316 Therapy/tele-psych/case    °[facility] [address]  ° [place], [place] (336) 349-2233    °Dr. Arfeen  (336) 349-4544   °[facility] [place]  [employer] [place] [facility] 1) [address], [place] °2) [address], [place] °3)  [address], [place] (336) 349-3220 °(336) 342-7768 ° °(336) 342-8140   °[place] [facility] (336) 342-1394 or (336) 342-3537 (After Hours)    ° ° °

## 2014-10-03 NOTE — Progress Notes (Signed)
Orthopedic Tech Progress Note Patient Details:  Dale Calderon 06-Feb-1981 [id number]  Ortho Devices Type of Ortho Device: Ace wrap, Volar splint Ortho Device/Splint Interventions: Application   Cammer, Mickie Bail 10/03/2014, 2:41 PM
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
## 2014-10-03 NOTE — ED Notes (Signed)
Pt ambulated successfully without report of dizziness or shortness of breath.  Pt states he is still in severe pain.

## 2014-10-03 NOTE — ED Notes (Signed)
Pt able to verbalize understanding of discharge orders, and teach back reasons to return to ED.  Pt understands full need for follow-up.

## 2014-10-03 NOTE — ED Notes (Signed)
Transported to XR with XR tech
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
## 2014-10-03 NOTE — ED Notes (Signed)
Pt ambulated in hall with EMT.  This RN observed.  Gait steady and even.  Pt sts "I feel a lot better".  Denies any dizziness or SOB.

## 2014-10-03 NOTE — ED Notes (Signed)
Patient transported to X-ray 
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
## 2014-10-03 NOTE — ED Notes (Signed)
Returned from xray

## 2014-10-04 ENCOUNTER — Encounter (HOSPITAL_COMMUNITY): Payer: Self-pay | Admitting: Emergency Medicine

## 2014-10-05 ENCOUNTER — Encounter (HOSPITAL_COMMUNITY): Payer: Self-pay | Admitting: Emergency Medicine

## 2015-06-19 ENCOUNTER — Encounter (HOSPITAL_COMMUNITY): Payer: Self-pay | Admitting: Emergency Medicine

## 2015-06-19 ENCOUNTER — Emergency Department (HOSPITAL_COMMUNITY)
Admission: EM | Admit: 2015-06-19 | Discharge: 2015-06-20 | Disposition: A | Discharge status: Home / Self Care | Payer: No Typology Code available for payment source | Attending: Emergency Medicine | Admitting: Emergency Medicine

## 2015-06-19 DIAGNOSIS — F909 Attention-deficit hyperactivity disorder, unspecified type: Secondary | ICD-10-CM | POA: Insufficient documentation

## 2015-06-19 DIAGNOSIS — Z79899 Other long term (current) drug therapy: Secondary | ICD-10-CM | POA: Insufficient documentation

## 2015-06-19 DIAGNOSIS — Z791 Long term (current) use of non-steroidal anti-inflammatories (NSAID): Secondary | ICD-10-CM | POA: Insufficient documentation

## 2015-06-19 DIAGNOSIS — K0381 Cracked tooth: Secondary | ICD-10-CM | POA: Insufficient documentation

## 2015-06-19 DIAGNOSIS — H109 Unspecified conjunctivitis: Secondary | ICD-10-CM

## 2015-06-19 DIAGNOSIS — K0889 Other specified disorders of teeth and supporting structures: Secondary | ICD-10-CM

## 2015-06-19 NOTE — ED Notes (Signed)
Patient c/o left eye irritation with redness and drainage x1 day. Also c/o upper left dental pain. Denies blurred/double vision.

## 2015-06-20 MED ORDER — ERYTHROMYCIN 5 MG/GM OP OINT
TOPICAL_OINTMENT | Freq: Four times a day (QID) | OPHTHALMIC | Status: DC
  Administered 2015-06-20: 1 via OPHTHALMIC
  Filled 2015-06-20: qty 3.5

## 2015-06-20 MED ORDER — AMOXICILLIN 500 MG PO CAPS: 500.0000 mg | ORAL_CAPSULE | Freq: Three times a day (TID) | ORAL | Status: DC

## 2015-06-20 MED ORDER — IBUPROFEN 600 MG PO TABS: 600.0000 mg | ORAL_TABLET | Freq: Four times a day (QID) | ORAL | Status: DC | PRN

## 2015-06-20 MED ORDER — TETRACAINE HCL 0.5 % OP SOLN
2.0000 [drp] | Freq: Once | OPHTHALMIC | Status: AC
  Administered 2015-06-20: 2 [drp] via OPHTHALMIC
  Filled 2015-06-20: qty 4

## 2015-06-20 MED ORDER — FLUORESCEIN SODIUM 1 MG OP STRP
1.0000 | ORAL_STRIP | Freq: Once | OPHTHALMIC | Status: AC
  Administered 2015-06-20: 1 via OPHTHALMIC
  Filled 2015-06-20: qty 1

## 2015-06-20 NOTE — ED Provider Notes (Signed)
CSN: [id number]     Arrival date & time 06/19/15  2218 History   First MD Initiated Contact with Patient 06/20/15 0127     Chief Complaint  Patient presents with  . Eye Drainage  . Dental Pain     (Consider location/radiation/quality/duration/timing/severity/associated sxs/prior Treatment) HPI Dale Calderon is a 35 y.o. male presents to emergency department complaining of left eye pain and redness, as well as a dental pain. Patient states that he was using pressure washer earlier, and states approximately an hour after he develop itching sensation in the left eye. States his eyes red and draining. He denies any photophobia or pain, he states he just feels something is in his eye. He denies any visual changes. Denies headache. He states he is also having pain in the left upper tooth, states it is broken off to the gumline and he knows he needs to see a dentist. Denies facial swelling. No pain with extraoccular movements. No fever or chills.   Past Medical History  Diagnosis Date  . ADHD (attention deficit hyperactivity disorder)    History reviewed. No pertinent past surgical history. No family history on file. Social History  Substance Use Topics  . Smoking status: Never Smoker   . Smokeless tobacco: None  . Alcohol Use: Yes     Comment: occ    Review of Systems  Constitutional: Negative for fever and chills.  Eyes: Positive for discharge, redness and itching. Negative for photophobia, pain and visual disturbance.  Respiratory: Negative for cough, chest tightness and shortness of breath.   Cardiovascular: Negative for chest pain, palpitations and leg swelling.  Musculoskeletal: Negative for myalgias, arthralgias, neck pain and neck stiffness.  Skin: Negative for rash.  Allergic/Immunologic: Negative for immunocompromised state.  Neurological: Negative for dizziness, weakness, light-headedness, numbness and headaches.      Allergies  Review of patient's allergies indicates no  known allergies.  Home Medications   Prior to Admission medications   Medication Sig Start Date End Date Taking? Authorizing Provider  amphetamine-dextroamphetamine (ADDERALL) 30 MG tablet Take 30 mg by mouth 2 (two) times daily.    Historical Provider, MD  amphetamine-dextroamphetamine (ADDERALL) 30 MG tablet Take 30 mg by mouth daily.    Historical Provider, MD  cephALEXin (KEFLEX) 500 MG capsule Take 1 capsule (500 mg total) by mouth 4 (four) times daily. For 10 days. 10/03/14   Marily Memos, MD  clonazePAM (KLONOPIN) 1 MG tablet Take 0.5-1 mg by mouth at bedtime.    Historical Provider, MD  HYDROcodone-acetaminophen (NORCO/VICODIN) 5-325 MG per tablet Take 1-2 tablets by mouth every 6 (six) hours as needed. 10/03/14   Marily Memos, MD  meloxicam (MOBIC) 15 MG tablet Take 1 tablet (15 mg total) by mouth daily. 08/14/14   Ivery Quale, PA-C   BP 129/94 mmHg  Pulse 71  Temp(Src) 97.8 F (36.6 C) (Oral)  Resp 18  SpO2 97% Physical Exam  Constitutional: He is oriented to person, place, and time. He appears well-developed and well-nourished. No distress.  HENT:  Head: Normocephalic and atraumatic.  Left upper first molar broke into the gumline surrounding gum swelling and tenderness. No swelling under the tongue. No facial swelling.  Eyes: EOM are normal. Pupils are equal, round, and reactive to light. Right eye exhibits no discharge. Left eye exhibits discharge.  Left conjunctiva injected, crusted drainage. No obvious corneal abrasion on fluorescein stain. No hyphema or hypopyon. No pain with extraocular movement.  Neck: Neck supple.  Cardiovascular: Normal rate, regular rhythm and  normal heart sounds.   Pulmonary/Chest: Effort normal. No respiratory distress. He has no wheezes. He has no rales.  Musculoskeletal: He exhibits no edema.  Neurological: He is alert and oriented to person, place, and time.  Skin: Skin is warm and dry.  Nursing note and vitals reviewed.   ED Course   Procedures (including critical care time) Labs Review Labs Reviewed - No data to display  Imaging Review No results found. I have personally reviewed and evaluated these images and lab results as part of my medical decision-making.   EKG Interpretation None      MDM   Final diagnoses:  Conjunctivitis of left eye  Pain, dental   Patient will left eye injection, drainage, sensation of a foreign body. I examined the eye, I do not see any foreign body. Fluorescein stain shows no obvious corneal abrasion. No hypopyon or hyphema on the exam. Question conjunctivitis. No photophobia or pain, visual acuity 20/20 in the right eye, 20/35 in the left. Doubt uveitis. Will start on antibiotic eyedrops and have him follow-up with ophthalmologist if worsening. Will do antibiotic for dental infection. Follow up with a dentist.   Filed Vitals:   06/19/15 2255  BP: 129/94  Pulse: 71  Temp: 97.8 F (36.6 C)  TempSrc: Oral  Resp: 18  SpO2: 97%     Jaynie Crumbleatyana Katianna Mcclenney, PA-C 06/20/15 0545  Devoria AlbeIva Knapp, MD 06/20/15 [phone]

## 2015-06-20 NOTE — Discharge Instructions (Signed)
Ibuprofen for pain. Amoxil as prescribed until all gone for dental infection. Follow up with a dentist. Follow up with eye specialist if symptoms not improving by Monday.    Bacterial Conjunctivitis Bacterial conjunctivitis, commonly called pink eye, is an inflammation of the clear membrane that covers the white part of the eye (conjunctiva). The inflammation can also happen on the underside of the eyelids. The blood vessels in the conjunctiva become inflamed, causing the eye to become red or pink. Bacterial conjunctivitis may spread easily from one eye to another and from person to person (contagious).  CAUSES  Bacterial conjunctivitis is caused by bacteria. The bacteria may come from your own skin, your upper respiratory tract, or from someone else with bacterial conjunctivitis. SYMPTOMS  The normally white color of the eye or the underside of the eyelid is usually pink or red. The pink eye is usually associated with irritation, tearing, and some sensitivity to light. Bacterial conjunctivitis is often associated with a thick, yellowish discharge from the eye. The discharge may turn into a crust on the eyelids overnight, which causes your eyelids to stick together. If a discharge is present, there may also be some blurred vision in the affected eye. DIAGNOSIS  Bacterial conjunctivitis is diagnosed by your caregiver through an eye exam and the symptoms that you report. Your caregiver looks for changes in the surface tissues of your eyes, which may point to the specific type of conjunctivitis. A sample of any discharge may be collected on a cotton-tip swab if you have a severe case of conjunctivitis, if your cornea is affected, or if you keep getting repeat infections that do not respond to treatment. The sample will be sent to a lab to see if the inflammation is caused by a bacterial infection and to see if the infection will respond to antibiotic medicines. TREATMENT   Bacterial conjunctivitis is  treated with antibiotics. Antibiotic eyedrops are most often used. However, antibiotic ointments are also available. Antibiotics pills are sometimes used. Artificial tears or eye washes may ease discomfort. HOME CARE INSTRUCTIONS   To ease discomfort, apply a cool, clean washcloth to your eye for 10-20 minutes, 3-4 times a day.  Gently wipe away any drainage from your eye with a warm, wet washcloth or a cotton ball.  Wash your hands often with soap and water. Use paper towels to dry your hands.  Do not share towels or washcloths. This may spread the infection.  Change or wash your pillowcase every day.  You should not use eye makeup until the infection is gone.  Do not operate machinery or drive if your vision is blurred.  Stop using contact lenses. Ask your caregiver how to sterilize or replace your contacts before using them again. This depends on the type of contact lenses that you use.  When applying medicine to the infected eye, do not touch the edge of your eyelid with the eyedrop bottle or ointment tube. SEEK IMMEDIATE MEDICAL CARE IF:   Your infection has not improved within 3 days after beginning treatment.  You had yellow discharge from your eye and it returns.  You have increased eye pain.  Your eye redness is spreading.  Your vision becomes blurred.  You have a fever or persistent symptoms for more than 2-3 days.  You have a fever and your symptoms suddenly get worse.  You have facial pain, redness, or swelling. MAKE SURE YOU:   Understand these instructions.  Will watch your condition.  Will get help right  away if you are not doing well or get worse.   This information is not intended to replace advice given to you by your health care provider. Make sure you discuss any questions you have with your health care provider.   Document Released: 01/23/2005 Document Revised: 02/13/2014 Document Reviewed: 06/26/2011 Elsevier Interactive Patient Education 2016  [employer].  [employer] [facility] [employer] is a great source of information about community services available.  Access by dialing 2-1-1 from anywhere in [place], or by website -  PooledIncome.pl.   Other Local Resources (Updated 02/2015)  Dental  Care   Services    Phone Number and Address  Cost   [facility] For children 76 - 33 years of age:   Cleaning  Tooth brushing/flossing instruction  Sealants, fillings, crowns  Extractions  Emergency treatment  [phone] [address] [place], [place] [address] Charges based on family income.  Medicaid and some insurance plans accepted.     [facility] - [facility], fillings, crowns  Extractions  Emergency treatment [phone] [address] [place], [place]  Pregnant women 66 years of age or older with a Medicaid card  [facility] - [place]  Cleaning  Sealants, fillings, crowns  Extractions  Emergency treatment [phone] [address] [place], [place] Pregnant women 65 years of age or older with a Medicaid card  [facility] - [facility] For children 58 - 39 years of age:   Cleaning  Tooth brushing/flossing instruction  Sealants, fillings, crowns  Extractions  Emergency treatment Limited orthodontic services for patients with Medicaid [phone] [address] [place], [place] [address] Medicaid and [facility] Health Choice cover for children up to age 4 and pregnant women.  Parents of children up to age 50 without Medicaid pay a reduced fee at time of service.  [facility] of [employer] For children 21 - 63 years of age:   Cleaning  Tooth brushing/flossing instruction  Sealants, fillings, crowns  Extractions  Emergency treatment Limited orthodontic services for patients with  Medicaid [phone] [address] [place], [place].  Medicaid and [place] Health Choice cover for children up to age 37 and pregnant women.  Parents of children up to age 64 without Medicaid pay a reduced fee.  [facility]  Sealants, fillings, crowns  Extractions  Hours: Tuesdays and Thursdays, 4:15 - 8 pm [phone] [address] [place], [place] [address] Services free of charge to [facility] residents ages 18-64 who do not have health insurance, Medicare, [place], or [place] benefits and fall within federal poverty guidelines  [employer]    Provides dental care in addition to primary medical care, nutritional counseling, and pharmacy:  Nurse, mental health, fillings, crowns  Extractions                  [phone] [facility], [address] [place], [place]  [phone] [facility], [address] [place], [place]  [phone] [facility] [place], [place]  [phone] [facility], [address] [place], [place]  [phone] [facility] [address] [place], [place] Accepts [place], [place], most insurance.  Also provides services available to all with fees adjusted based on ability to pay.    [employer]  [facility]  Cleaning  Tooth brushing/flossing instruction  Sealants, fillings, crowns  Extractions  Emergency treatment Hours: Tuesdays, Thursdays, and Fridays from 8 am to 5 pm by appointment only. [phone] [address] [place], [place] [address] [facility] residents with Medicaid (depending on eligibility) and children with [facility] Health Choice - call for more information.  [facility]  Extractions only  Hours: 2nd and 4th Thursday of each month from 6:30 am - 9 am.   [phone] ext. [phone] [place], [place] [address] Ages 23 and older only.  Patients are seen on a first come, first served basis.  [employer]  [employer]  Extractions  Orthodontics  Endodontics  Implants/Crowns/Bridges  Complete and partial dentures [phone] [place], [place] Patients must complete an application for services.  There is often a waiting list.

## 2015-10-09 ENCOUNTER — Emergency Department (HOSPITAL_COMMUNITY): Payer: Self-pay

## 2015-10-09 ENCOUNTER — Encounter (HOSPITAL_COMMUNITY): Payer: Self-pay | Admitting: Nurse Practitioner

## 2015-10-09 ENCOUNTER — Emergency Department (HOSPITAL_COMMUNITY)
Admission: EM | Admit: 2015-10-09 | Discharge: 2015-10-09 | Disposition: A | Discharge status: Home / Self Care | Payer: Self-pay | Attending: Emergency Medicine | Admitting: Emergency Medicine

## 2015-10-09 DIAGNOSIS — R0989 Other specified symptoms and signs involving the circulatory and respiratory systems: Secondary | ICD-10-CM

## 2015-10-09 DIAGNOSIS — J988 Other specified respiratory disorders: Secondary | ICD-10-CM | POA: Insufficient documentation

## 2015-10-09 DIAGNOSIS — J989 Respiratory disorder, unspecified: Secondary | ICD-10-CM

## 2015-10-09 DIAGNOSIS — F909 Attention-deficit hyperactivity disorder, unspecified type: Secondary | ICD-10-CM | POA: Insufficient documentation

## 2015-10-09 MED ORDER — IPRATROPIUM-ALBUTEROL 0.5-2.5 (3) MG/3ML IN SOLN
3.0000 mL | Freq: Once | RESPIRATORY_TRACT | Status: AC
  Administered 2015-10-09: 3 mL via RESPIRATORY_TRACT
  Filled 2015-10-09: qty 3

## 2015-10-09 MED ORDER — PREDNISONE 20 MG PO TABS
40.0000 mg | ORAL_TABLET | Freq: Once | ORAL | Status: AC
  Administered 2015-10-09: 40 mg via ORAL
  Filled 2015-10-09: qty 2

## 2015-10-09 MED ORDER — PREDNISONE 10 MG PO TABS
20.0000 mg | ORAL_TABLET | Freq: Two times a day (BID) | ORAL | 0 refills | Status: DC
Start: 2015-10-09 — End: 2017-07-12

## 2015-10-09 MED ORDER — ALBUTEROL SULFATE HFA 108 (90 BASE) MCG/ACT IN AERS
2.0000 | INHALATION_SPRAY | RESPIRATORY_TRACT | Status: DC | PRN
  Administered 2015-10-09: 2 via RESPIRATORY_TRACT
  Filled 2015-10-09: qty 6.7

## 2015-10-09 NOTE — ED Provider Notes (Signed)
[facility]-EMERGENCY DEPT Provider Note   CSN: [id number] Arrival date & time: 10/09/15  1750  By signing my name below, I, Alyssa Grove, attest that this documentation has been prepared under the direction and in the presence of Kerrie Buffalo, NP. Electronically Signed: Alyssa Grove, ED Scribe. 10/09/15. 6:16 PM.  History   Chief Complaint Chief Complaint  Patient presents with  . Cough   The history is provided by the patient. No language interpreter was used.    HPI Comments: Dale Calderon is a 35 y.o. male who presents to the Emergency Department complaining of a gradual onset, nonproductive cough for 1 week. He states he took 3 Ibuprofen this morning with no relief to symptoms. 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 Pt reports wheezing onset yesterday, rhinorrhea, shortness of breath, abdominal pain, back pain, fever, headache, chills, nausea, and vomiting. Back pan is exacerbated during deep inhalation. Pt reports 1 episode of vomiting on 10/07/2015 and notes that he felt better afterwards. He states he has low energy and experiences shortness of breath when walking across the room. He denies PMHx of asthma. He is a smoker and smokes approximately 0.5 packs a day. Pt denies recent surgeries. He denies urinary symptoms, diarrhea, ear pain.   Past Medical History:  Diagnosis Date  . ADHD (attention deficit hyperactivity disorder)     There are no active problems to display for this patient.   History reviewed. No pertinent surgical history.   Home Medications    Prior to Admission medications   Medication Sig Start Date End Date Taking? Authorizing Provider  amoxicillin (AMOXIL) 500 MG capsule Take 1 capsule (500 mg total) by mouth 3 (three) times daily. 06/20/15   Tatyana Kirichenko, PA-C  amphetamine-dextroamphetamine (ADDERALL) 30 MG tablet Take 30 mg by mouth 2 (two)  times daily.    Historical Provider, MD  amphetamine-dextroamphetamine (ADDERALL) 30 MG tablet Take 30 mg by mouth daily.    Historical Provider, MD  cephALEXin (KEFLEX) 500 MG capsule Take 1 capsule (500 mg total) by mouth 4 (four) times daily. For 10 days. 10/03/14   Marily Memos, MD  clonazePAM (KLONOPIN) 1 MG tablet Take 0.5-1 mg by mouth at bedtime.    Historical Provider, MD  HYDROcodone-acetaminophen (NORCO/VICODIN) 5-325 MG per tablet Take 1-2 tablets by mouth every 6 (six) hours as needed. 10/03/14   Marily Memos, MD  ibuprofen (ADVIL,MOTRIN) 600 MG tablet Take 1 tablet (600 mg total) by mouth every 6 (six) hours as needed. 06/20/15   Tatyana Kirichenko, PA-C  meloxicam (MOBIC) 15 MG tablet Take 1 tablet (15 mg total) by mouth daily. 08/14/14   Ivery Quale, PA-C  predniSONE (DELTASONE) 10 MG tablet Take 2 tablets (20 mg total) by mouth 2 (two) times daily with a meal. 10/09/15   Hope Orlene Och, NP    Family History History reviewed. No pertinent family history.  Social History Social History  Substance Use Topics  . Smoking status: Never Smoker  . Smokeless tobacco: Never Used  . Alcohol use Yes     Comment: occ     Allergies   Review of patient's allergies indicates no known allergies.   Review of Systems Review of Systems  Constitutional: Positive for chills. Fever: 99.  HENT: Positive for rhinorrhea. Negative for ear pain.   Respiratory: Positive for cough, shortness of breath and wheezing.   Gastrointestinal: Negative  for diarrhea. Vomiting: one episode of n/v a few days ago, none now.  Genitourinary: Negative for dysuria, frequency and hematuria.  Musculoskeletal: Positive for myalgias.  Skin: Negative for wound.  Neurological: Positive for headaches.  Psychiatric/Behavioral: The patient is not nervous/anxious.      Physical Exam Updated Vital Signs BP 122/85   Pulse 100   Temp 99.4 F (37.4 C) (Oral)   Resp 18   SpO2 97%   Physical Exam  Constitutional: He  appears well-developed and well-nourished.  HENT:  Head: Normocephalic.  Right Ear: Tympanic membrane and external ear normal.  Left Ear: Tympanic membrane and external ear normal.  Mouth/Throat: Uvula is midline.  No edema or erythema  Eyes: Conjunctivae and EOM are normal. Pupils are equal, round, and reactive to light.  Sclera clear  Cardiovascular: Regular rhythm and normal heart sounds.  Tachycardia present.   Pulmonary/Chest: Effort normal. No respiratory distress. He has wheezes.  Expiratory and inspiratory wheezes bilaterally  Abdominal: Soft. Bowel sounds are normal. He exhibits no distension. There is no tenderness.  Musculoskeletal: Normal range of motion.  Neurological: He is alert.  Skin: Skin is warm and dry.  Psychiatric: He has a normal mood and affect. His behavior is normal.  Nursing note and vitals reviewed.    ED Treatments / Results  DIAGNOSTIC STUDIES: Oxygen Saturation is 97% on RA, adequate by my interpretation.    COORDINATION OF CARE: 6:07 PM Discussed treatment plan with pt at bedside which includes breathing treatment and DG Chest and pt agreed to plan.  Radiology Dg Chest 2 View  Result Date: 10/09/2015 CLINICAL DATA:  Cough, fever, wheezing, and shortness of breath for 2 days. Smoker. EXAM: CHEST  2 VIEW COMPARISON:  None. FINDINGS: The heart size and mediastinal contours are within normal limits. Both lungs are clear. The visualized skeletal structures are unremarkable. IMPRESSION: No active cardiopulmonary disease. Electronically Signed   By: Burman NievesWilliam  Stevens M.D.   On: 10/09/2015 18:51    Procedures Procedures (including critical care time)  Medications Ordered in ED Medications  albuterol (PROVENTIL HFA;VENTOLIN HFA) 108 (90 Base) MCG/ACT inhaler 2 puff (not administered)  predniSONE (DELTASONE) tablet 40 mg (not administered)  ipratropium-albuterol (DUONEB) 0.5-2.5 (3) MG/3ML nebulizer solution 3 mL (3 mLs Nebulization Given 10/09/15 1814)      Initial Impression / Assessment and Plan / ED Course  I have reviewed the triage vital signs and the nursing notes.  Pertinent  imaging results that were available during my care of the patient were reviewed by me and considered in my medical decision making (see chart for details).  Clinical Course  I discussed this case with Dr. Manus Gunningancour.  Duoneb, CXR  [id number] Patient re examined. Much improved after neb treatment. Patient continues to have occasional wheezes. Decreased symptoms of shortness of breath. Patient states he feels better.   Final Clinical Impressions(s) / ED Diagnoses  35 y.o. male with wheezing stable for d/c with improved symptoms after Duoneb. Albuterol inhaler given in the ED with instructions on use. Will give steroid burst. Discussed with the patient and all questioned fully answered. He voices understanding and agrees with plan.    Final diagnoses:  Reactive airway disease that is not asthma    New Prescriptions New Prescriptions   PREDNISONE (DELTASONE) 10 MG TABLET    Take 2 tablets (20 mg total) by mouth 2 (two) times daily with a meal.   I personally performed the services described in this documentation, which was scribed in my presence.  The recorded information has been reviewed and is accurate.    [place], NP 10/09/15 1934    Glynn Octave, MD 10/09/15 [phone]

## 2015-10-09 NOTE — ED Triage Notes (Signed)
Pt c/o 1 week history runny nose, cough, back pain, fevers. Pain is increased with inspiration. He Is alert and breathing easily.

## 2015-10-09 NOTE — Discharge Instructions (Signed)
Use the Albuterol Inhaler every 4 hours as needed for wheezing. Take the steroids as directed. Follow up with your doctor or return here as needed for worsening symptoms.

## 2015-10-09 NOTE — ED Notes (Signed)
Pt departed in NAD.  

## 2016-09-28 ENCOUNTER — Emergency Department (HOSPITAL_COMMUNITY): Payer: Self-pay

## 2016-09-28 ENCOUNTER — Emergency Department (HOSPITAL_COMMUNITY)
Admission: EM | Admit: 2016-09-28 | Discharge: 2016-09-28 | Disposition: A | Discharge status: Home / Self Care | Payer: Self-pay | Attending: Emergency Medicine | Admitting: Emergency Medicine

## 2016-09-28 ENCOUNTER — Emergency Department (HOSPITAL_COMMUNITY)
Admission: EM | Admit: 2016-09-28 | Discharge: 2016-09-29 | Disposition: A | Discharge status: Home / Self Care | Payer: Self-pay | Attending: Emergency Medicine | Admitting: Emergency Medicine

## 2016-09-28 DIAGNOSIS — R072 Precordial pain: Secondary | ICD-10-CM

## 2016-09-28 DIAGNOSIS — Z720 Tobacco use: Secondary | ICD-10-CM | POA: Insufficient documentation

## 2016-09-28 DIAGNOSIS — Z79899 Other long term (current) drug therapy: Secondary | ICD-10-CM | POA: Insufficient documentation

## 2016-09-28 DIAGNOSIS — D72829 Elevated white blood cell count, unspecified: Secondary | ICD-10-CM | POA: Insufficient documentation

## 2016-09-28 DIAGNOSIS — R05 Cough: Secondary | ICD-10-CM | POA: Insufficient documentation

## 2016-09-28 DIAGNOSIS — R059 Cough, unspecified: Secondary | ICD-10-CM

## 2016-09-28 DIAGNOSIS — F909 Attention-deficit hyperactivity disorder, unspecified type: Secondary | ICD-10-CM | POA: Insufficient documentation

## 2016-09-28 DIAGNOSIS — J189 Pneumonia, unspecified organism: Secondary | ICD-10-CM | POA: Insufficient documentation

## 2016-09-28 DIAGNOSIS — F1994 Other psychoactive substance use, unspecified with psychoactive substance-induced mood disorder: Secondary | ICD-10-CM | POA: Diagnosis present

## 2016-09-28 DIAGNOSIS — J181 Lobar pneumonia, unspecified organism: Secondary | ICD-10-CM

## 2016-09-28 LAB — CBC
HEMATOCRIT: 36.4 % — AB (ref 39.0–52.0)
Hemoglobin: 12.9 g/dL — ABNORMAL LOW (ref 13.0–17.0)
MCH: 30.1 pg (ref 26.0–34.0)
MCHC: 35.4 g/dL (ref 30.0–36.0)
MCV: 85 fL (ref 78.0–100.0)
PLATELETS: 224 10*3/uL (ref 150–400)
RBC: 4.28 MIL/uL (ref 4.22–5.81)
RDW: 11.7 % (ref 11.5–15.5)
WBC: 16.6 10*3/uL — ABNORMAL HIGH (ref 4.0–10.5)

## 2016-09-28 LAB — I-STAT TROPONIN, ED: Troponin i, poc: 0 ng/mL (ref 0.00–0.08)

## 2016-09-28 LAB — BASIC METABOLIC PANEL
Anion gap: 8 (ref 5–15)
BUN: 6 mg/dL (ref 6–20)
CHLORIDE: 102 mmol/L (ref 101–111)
CO2: 25 mmol/L (ref 22–32)
CREATININE: 0.8 mg/dL (ref 0.61–1.24)
Calcium: 8.3 mg/dL — ABNORMAL LOW (ref 8.9–10.3)
GFR calc Af Amer: >60 mL/min (ref >60)
GFR calc non Af Amer: >60 mL/min (ref >60)
GLUCOSE: 108 mg/dL — AB (ref 65–99)
POTASSIUM: 3.3 mmol/L — AB (ref 3.5–5.1)
Sodium: 135 mmol/L (ref 135–145)

## 2016-09-28 MED ORDER — DOXYCYCLINE HYCLATE 100 MG PO TABS
100.0000 mg | ORAL_TABLET | Freq: Once | ORAL | Status: AC
  Administered 2016-09-28: 100 mg via ORAL
  Filled 2016-09-28: qty 1

## 2016-09-28 MED ORDER — ALBUTEROL SULFATE (2.5 MG/3ML) 0.083% IN NEBU
5.0000 mg | INHALATION_SOLUTION | Freq: Once | RESPIRATORY_TRACT | Status: AC
  Administered 2016-09-28: 5 mg via RESPIRATORY_TRACT
  Filled 2016-09-28: qty 6

## 2016-09-28 MED ORDER — PREDNISONE 20 MG PO TABS
60.0000 mg | ORAL_TABLET | Freq: Once | ORAL | Status: AC
  Administered 2016-09-28: 60 mg via ORAL
  Filled 2016-09-28: qty 3

## 2016-09-28 MED ORDER — IPRATROPIUM BROMIDE 0.02 % IN SOLN
0.5000 mg | Freq: Once | RESPIRATORY_TRACT | Status: AC
  Administered 2016-09-28: 0.5 mg via RESPIRATORY_TRACT
  Filled 2016-09-28: qty 2.5

## 2016-09-28 MED ORDER — DOXYCYCLINE HYCLATE 100 MG PO CAPS: 100.0000 mg | ORAL_CAPSULE | Freq: Two times a day (BID) | ORAL | 0 refills | Status: DC

## 2016-09-28 MED ORDER — ACETAMINOPHEN 500 MG PO TABS
1000.0000 mg | ORAL_TABLET | Freq: Once | ORAL | Status: AC
  Administered 2016-09-28: 1000 mg via ORAL
  Filled 2016-09-28: qty 2

## 2016-09-28 MED ORDER — ALBUTEROL SULFATE HFA 108 (90 BASE) MCG/ACT IN AERS
2.0000 | INHALATION_SPRAY | Freq: Once | RESPIRATORY_TRACT | Status: AC
  Administered 2016-09-28: 2 via RESPIRATORY_TRACT
  Filled 2016-09-28: qty 6.7

## 2016-09-28 MED ORDER — PREDNISONE 20 MG PO TABS: ORAL_TABLET | ORAL | 0 refills | Status: DC

## 2016-09-28 MED ORDER — ALBUTEROL SULFATE HFA 108 (90 BASE) MCG/ACT IN AERS: 2.0000 | INHALATION_SPRAY | RESPIRATORY_TRACT | 0 refills | Status: DC | PRN

## 2016-09-28 NOTE — ED Notes (Signed)
Pt trying to remove tape from arm, accidentally removed IV.

## 2016-09-28 NOTE — ED Triage Notes (Signed)
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

## 2016-09-28 NOTE — ED Notes (Signed)
Pt stated "I guess because I got into an argument with my sister is why I'm here.  I have 1 sister that's strung out on drugs and my father has just started taking her to a methadone clinic.  I started taking prednisone 2 days ago with an abx for pneumonia.  I've been taking adderall for years."

## 2016-09-28 NOTE — ED Notes (Signed)
Patient transported to X-ray 

## 2016-09-28 NOTE — Discharge Instructions (Signed)
Continue to stay well-hydrated. Continue to alternate between Tylenol and Ibuprofen for pain or fever. Use Mucinex for cough suppression/expectoration of mucus. Use netipot and flonase to help with nasal congestion. May consider over-the-counter Benadryl or other antihistamine to decrease secretions and for help with your symptoms. Take antibiotic as directed until completed starting tonight at bedtime. Take prednisone as directed, starting tomorrow since today's dose was given to you here. Use inhaler as directed, as needed for cough/chest congestion/wheezing/shortness of breath. STOP SMOKING! Follow up with your primary care doctor in 5-7 days for recheck of ongoing symptoms. Return to emergency department for emergent changing or worsening of symptoms.

## 2016-09-28 NOTE — ED Notes (Signed)
Bed: [facility] Expected date:  Expected time:  Means of arrival:  Comments: 

## 2016-09-28 NOTE — ED Notes (Signed)
While ambulating the pt, pt's O2 was between 93%-95%. Pt stated that he felt "jittery". Informed Mercedes - PA.

## 2016-09-28 NOTE — ED Notes (Signed)
Pt in XR. 

## 2016-09-28 NOTE — ED Triage Notes (Signed)
Patient comes in per [facility] with cp x2wks, cp in center of chest. Taking ibuprofen which helped but ran out today. Nitro x1, aspirin 324 mg given in route. Was 10/10 now 2/10. Described as sharp. Radiating into left arm. ekg SR with PVCs. C/o cold symptoms and lower back pain. Nonproductive cough noted. 118/74, 95 HR, 14 RR, 94% RA. 18 RAC.

## 2016-09-28 NOTE — ED Notes (Signed)
Pt is A&Ox4 

## 2016-09-28 NOTE — ED Provider Notes (Signed)
[facility]-EMER[facility]ENCY DEPT Provider Note   CSN: [id number] Arrival date & time: 09/28/16  1232     History   Chief Complaint Chief Complaint  Patient presents with  . Chest Pain    HPI Dale Calderon is a 36 y.o. male with a PMHx of ADHD, who presents to the ED with complaints of left-sided chest pain and cough/URI symptoms 2 weeks. Patient states that he developed a cold 2 weeks ago, having a cough with yellow sputum production, wheezing, yellowish greenish rhinorrhea, and subjective fever with chills. He has also been having left-sided chest pain that he describes as 3/10 stabbing/sharp intermittent left anterior chest pain that radiates to the left shoulder, worse with deep inspiration, and mildly improved with Aleve, Robitussin, and allergy medications. States that the pain only really happens with inspiration, otherwise the pain is absent. He admits to being a smoker. Denies any sick contacts and has never been diagnosed with COPD or asthma however last year when he got sick with a cold he did need a breathing treatment and steroids. 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 He reports having a family history of CAD and 2 of his paternal uncles. He denies diaphoresis, lightheadedness, ear pain/drainage, sore throat, hemoptysis, SOB, LE swelling, recent travel/surgery/immobilization, known personal/family hx of DVT/PE, abd pain, N/V/D/C, hematuria, dysuria, myalgias, arthralgias, claudication, orthopnea, numbness, tingling, focal weakness, or any other complaints at this time.    The history is provided by the patient and medical records. No language interpreter was used.  Chest Pain   This is a new problem. The current episode started more than 1 week ago. The problem occurs daily. The problem has not changed since onset.The pain is associated with breathing. The pain is present in the lateral region. The pain is at a severity of 3/10. The pain is mild. The quality of the pain  is described as sharp and stabbing. The pain radiates to the left shoulder. Duration of episode(s) is 2 weeks. The symptoms are aggravated by deep breathing. Associated symptoms include cough and a fever (subjective). Pertinent negatives include no abdominal pain, no claudication, no diaphoresis, no hemoptysis, no lower extremity edema, no nausea, no numbness, no orthopnea, no shortness of breath, no vomiting and no weakness. Treatments tried: aleve, robitussin, allergy meds. The treatment provided moderate relief. Risk factors include smoking/tobacco exposure.  Pertinent negatives for past medical history include no DVT, no hyperlipidemia, no hypertension and no PE.  His family medical history is significant for CAD.  Pertinent negatives for family medical history include: no PE.    Past Medical History:  Diagnosis Date  . ADHD (attention deficit hyperactivity disorder)     There are no active problems to display for this patient.   No past surgical history on file.     Home Medications    Prior to Admission medications   Medication Sig Start Date End Date Taking? Authorizing Provider  amoxicillin (AMOXIL) 500 MG capsule Take 1 capsule (500 mg total) by mouth 3 (three) times daily. 06/20/15   Kirichenko, Tatyana, PA-C  amphetamine-dextroamphetamine (ADDERALL) 30 MG tablet Take 30 mg by mouth 2 (two) times daily.    [provider]  amphetamine-dextroamphetamine (ADDERALL) 30 MG tablet Take 30 mg by mouth daily.    [provider]  cephALEXin (KEFLEX) 500 MG capsule Take 1 capsule (500 mg total) by mouth 4 (four) times daily. For 10 days. 10/03/14   Mesner, Barbara Cower, MD  clonazePAM Scarlette Calico)  1 MG tablet Take 0.5-1 mg by mouth at bedtime.    [provider]  HYDROcodone-acetaminophen (NORCO/VICODIN) 5-325 MG per tablet Take 1-2 tablets by mouth every 6 (six) hours as needed. 10/03/14   Mesner, Barbara Cower, MD  ibuprofen (ADVIL,MOTRIN) 600 MG tablet Take 1 tablet (600 mg  total) by mouth every 6 (six) hours as needed. 06/20/15   Kirichenko, Lemont Fillers, PA-C  meloxicam (MOBIC) 15 MG tablet Take 1 tablet (15 mg total) by mouth daily. 08/14/14   Ivery Quale, PA-C  predniSONE (DELTASONE) 10 MG tablet Take 2 tablets (20 mg total) by mouth 2 (two) times daily with a meal. 10/09/15   Janne Napoleon, NP    Family History No family history on file.  Social History Social History  Substance Use Topics  . Smoking status: Never Smoker  . Smokeless tobacco: Never Used  . Alcohol use Yes     Comment: occ     Allergies   Patient has no known allergies.   Review of Systems Review of Systems  Constitutional: Positive for chills and fever (subjective). Negative for diaphoresis.  HENT: Positive for rhinorrhea. Negative for ear discharge, ear pain and sore throat.   Respiratory: Positive for cough and wheezing. Negative for hemoptysis and shortness of breath.   Cardiovascular: Positive for chest pain. Negative for orthopnea, claudication and leg swelling.  Gastrointestinal: Negative for abdominal pain, constipation, diarrhea, nausea and vomiting.  Genitourinary: Negative for dysuria and hematuria.  Musculoskeletal: Negative for arthralgias and myalgias.  Skin: Negative for color change.  Allergic/Immunologic: Negative for immunocompromised state.  Neurological: Negative for weakness, light-headedness and numbness.  Psychiatric/Behavioral: Negative for confusion.   All other systems reviewed and are negative for acute change except as noted in the HPI.    Physical Exam Updated Vital Signs BP 123/76 (BP Location: Left Arm)   Pulse 96   Temp 100 F (37.8 C) (Oral)   Resp (!) 21   Ht 6\' 1"  (1.854 m)   Wt 76.2 kg (168 lb)   SpO2 94%   BMI 22.16 kg/m   Physical Exam  Constitutional: He is oriented to person, place, and time. Vital signs are normal. He appears well-developed and well-nourished.  Non-toxic appearance. No distress.  Low-grade temp 100F orally,  nontoxic, NAD, not septic appearing  HENT:  Head: Normocephalic and atraumatic.  Mouth/Throat: Oropharynx is clear and moist and mucous membranes are normal.  Eyes: Conjunctivae and EOM are normal. Right eye exhibits no discharge. Left eye exhibits no discharge.  Neck: Normal range of motion. Neck supple.  Cardiovascular: Normal rate, regular rhythm, normal heart sounds and intact distal pulses.  Exam reveals no gallop and no friction rub.   No murmur heard. RRR, nl s1/s2, no m/r/g, distal pulses intact, no pedal edema   Pulmonary/Chest: Effort normal. No respiratory distress. He has decreased breath sounds in the left lower field. He has wheezes. He has rhonchi. He has no rales. He exhibits tenderness. He exhibits no crepitus, no deformity and no retraction.  Course scattered rhonchi throughout but most pronounced in L lower lung field with slightly diminished sounds in this area, faint expiratory wheezing throughout, no rales appreciated, no hypoxia or increased WOB, speaking in full sentences, SpO2 94-98% on RA Chest wall with mild L anterior TTP extending towards the L shoulder area, without crepitus, deformities, or retractions   Abdominal: Soft. Normal appearance and bowel sounds are normal. He exhibits no distension. There is no tenderness. There is no rigidity, no rebound, no guarding,  no CVA tenderness, no tenderness at McBurney's point and negative Murphy's sign.  Musculoskeletal: Normal range of motion.  MAE x4 Strength and sensation grossly intact in all extremities Distal pulses intact Gait steady No pedal edema, neg homan's bilaterally   Neurological: He is alert and oriented to person, place, and time. He has normal strength. No sensory deficit.  Skin: Skin is warm, dry and intact. No rash noted.  Psychiatric: He has a normal mood and affect.  Nursing note and vitals reviewed.    ED Treatments / Results  Labs (all labs ordered are listed, but only abnormal results are  displayed) Labs Reviewed  BASIC METABOLIC PANEL - Abnormal; Notable for the following:       Result Value   Potassium 3.3 (*)    Glucose, Bld 108 (*)    Calcium 8.3 (*)    All other components within normal limits  CBC - Abnormal; Notable for the following:    WBC 16.6 (*)    Hemoglobin 12.9 (*)    HCT 36.4 (*)    All other components within normal limits  I-STAT TROPONIN, ED    EKG  EKG Interpretation  Date/Time:  Thursday September 28 2016 12:39:00 EDT Ventricular Rate:  88 PR Interval:    QRS Duration: 89 QT Interval:  334 QTC Calculation: 404 R Axis:   80 Text Interpretation:  Sinus arrhythmia Confirmed by Benjiman Core [phone]) on 09/28/2016 1:00:10 PM Also confirmed by Benjiman Core [phone]), editor Misty Stanley [phone])  on 09/28/2016 1:02:02 PM       Radiology Dg Chest 2 View  Result Date: 09/28/2016 CLINICAL DATA:  Shortness of breath with chest pain and cough. Fever. EXAM: CHEST  2 VIEW COMPARISON:  October 09, 2015 FINDINGS: There is airspace consolidation in the inferior lingula. There is mild interstitial prominence in the right mid and lower lung zones, a stable finding compared to prior study. Heart size and pulmonary vascular normal. No adenopathy. No bone lesions. IMPRESSION: Airspace opacity in the lingula inferiorly consistent with pneumonia. Interstitial prominence in the right mid and lower lung zones is stable and likely reflects chronic inflammatory type change. No adenopathy. Cardiac silhouette within normal limits. Electronically Signed   By: Bretta Bang III M.D.   On: 09/28/2016 13:30    Procedures Procedures (including critical care time)  Medications Ordered in ED Medications  albuterol (PROVENTIL HFA;VENTOLIN HFA) 108 (90 Base) MCG/ACT inhaler 2 puff (not administered)  albuterol (PROVENTIL) (2.5 MG/3ML) 0.083% nebulizer solution 5 mg (5 mg Nebulization Given 09/28/16 1352)  ipratropium (ATROVENT) nebulizer solution 0.5 mg (0.5  mg Nebulization Given 09/28/16 1352)  predniSONE (DELTASONE) tablet 60 mg (60 mg Oral Given 09/28/16 1352)  acetaminophen (TYLENOL) tablet 1,000 mg (1,000 mg Oral Given 09/28/16 1352)  albuterol (PROVENTIL) (2.5 MG/3ML) 0.083% nebulizer solution 5 mg (5 mg Nebulization Given 09/28/16 1549)  ipratropium (ATROVENT) nebulizer solution 0.5 mg (0.5 mg Nebulization Given 09/28/16 1549)  doxycycline (VIBRA-TABS) tablet 100 mg (100 mg Oral Given 09/28/16 1549)     Initial Impression / Assessment and Plan / ED Course  I have reviewed the triage vital signs and the nursing notes.  Pertinent labs & imaging results that were available during my care of the patient were reviewed by me and considered in my medical decision making (see chart for details).     36 y.o. male here with cough/cold symptoms x2wks with CP and wheezing as well. On exam, mild L anterior chest wall TTP, course rhonchi throughout but primarily  in L lower lung field, with mildly diminished lung sounds in that area, faint expiratory wheezing throughout, no tachycardia or LE swelling, low grade temp 100F. Symptoms likely PNA, highly doubt DVT/PE; awaiting labs, EKG with sinus arrhythmia (one beat slightly delayed, but otherwise sinus rhythm), awaiting CXR. Will give prednisone and duoneb, and tylenol for his fever. Pt declines wanting anything for pain. Will reassess shortly  3:06 PM CBC with leukocytosis 16.6, no differential; also with borderline anemia which is stable from prior. BMP with mildly low K 3.3 but hemolyzed, doubt actually significant. Trop neg. CXR demonstrates inferior lingular PNA consistent with clinical picture. Lung sounds somewhat improved after duoneb, will repeat now; pt feeling somewhat better. Will repeat duoneb then likely d/c home with abx, will give first dose here. Will reassess shortly  4:32 PM Pt's lung sounds greatly improved after duoneb, feeling improved overall, ambulated without desats; feels jittery, likely  from albuterol, advised this will improve shortly. Will send home with inhaler, prednisone burst, and abx. Advised smoking cessation, discussed other OTC remedies for symptomatic relief. F/up with PCP in 5-7 days for recheck of symptoms. I explained the diagnosis and have given explicit precautions to return to the ER including for any other new or worsening symptoms. The patient understands and accepts the medical plan as it's been dictated and I have answered their questions. Discharge instructions concerning home care and prescriptions have been given. The patient is STABLE and is discharged to home in good condition.     Final Clinical Impressions(s) / ED Diagnoses   Final diagnoses:  Community acquired pneumonia of left lower lobe of lung (HCC)  Precordial chest pain  Cough  Leukocytosis, unspecified type  Tobacco user    New Prescriptions New Prescriptions   ALBUTEROL (PROVENTIL HFA;VENTOLIN HFA) 108 (90 BASE) MCG/ACT INHALER    Inhale 2 puffs into the lungs every 4 (four) hours as needed for wheezing or shortness of breath (cough).   DOXYCYCLINE (VIBRAMYCIN) 100 MG CAPSULE    Take 1 capsule (100 mg total) by mouth 2 (two) times daily. One po bid x 7 days STARTING TONIGHT (09/28/16) AT BEDTIME then continued twice daily until finished   PREDNISONE (DELTASONE) 20 MG TABLET    3 tabs po daily x 4 days STARTING 09/29/16     Jerson Furukawa, [place], [place] 09/28/16 1632    Benjiman Core, MD 09/30/16 0005

## 2016-09-29 DIAGNOSIS — F1994 Other psychoactive substance use, unspecified with psychoactive substance-induced mood disorder: Secondary | ICD-10-CM | POA: Diagnosis present

## 2016-09-29 LAB — CBC WITH DIFFERENTIAL/PLATELET
Basophils Absolute: 0 10*3/uL (ref 0.0–0.1)
Basophils Relative: 0 %
Eosinophils Absolute: 0 10*3/uL (ref 0.0–0.7)
Eosinophils Relative: 0 %
HEMATOCRIT: 38.6 % — AB (ref 39.0–52.0)
HEMOGLOBIN: 13.6 g/dL (ref 13.0–17.0)
LYMPHS ABS: 0.7 10*3/uL (ref 0.7–4.0)
Lymphocytes Relative: 5 %
MCH: 30.2 pg (ref 26.0–34.0)
MCHC: 35.2 g/dL (ref 30.0–36.0)
MCV: 85.8 fL (ref 78.0–100.0)
MONOS PCT: 3 %
Monocytes Absolute: 0.4 10*3/uL (ref 0.1–1.0)
NEUTROS ABS: 14.4 10*3/uL — AB (ref 1.7–7.7)
NEUTROS PCT: 92 %
Platelets: 265 10*3/uL (ref 150–400)
RBC: 4.5 MIL/uL (ref 4.22–5.81)
RDW: 11.9 % (ref 11.5–15.5)
WBC: 15.5 10*3/uL — ABNORMAL HIGH (ref 4.0–10.5)

## 2016-09-29 LAB — RAPID URINE DRUG SCREEN, HOSP PERFORMED
Amphetamines: POSITIVE — AB
Barbiturates: NOT DETECTED
Benzodiazepines: NOT DETECTED
Cocaine: NOT DETECTED
OPIATES: NOT DETECTED
Tetrahydrocannabinol: POSITIVE — AB

## 2016-09-29 LAB — COMPREHENSIVE METABOLIC PANEL
ALK PHOS: 65 U/L (ref 38–126)
ALT: 25 U/L (ref 17–63)
AST: 24 U/L (ref 15–41)
Albumin: 3.5 g/dL (ref 3.5–5.0)
Anion gap: 8 (ref 5–15)
BUN: 12 mg/dL (ref 6–20)
CALCIUM: 9 mg/dL (ref 8.9–10.3)
CO2: 27 mmol/L (ref 22–32)
CREATININE: 0.8 mg/dL (ref 0.61–1.24)
Chloride: 101 mmol/L (ref 101–111)
Glucose, Bld: 167 mg/dL — ABNORMAL HIGH (ref 65–99)
Potassium: 3.4 mmol/L — ABNORMAL LOW (ref 3.5–5.1)
Sodium: 136 mmol/L (ref 135–145)
TOTAL PROTEIN: 7.2 g/dL (ref 6.5–8.1)
Total Bilirubin: 0.6 mg/dL (ref 0.3–1.2)

## 2016-09-29 LAB — ACETAMINOPHEN LEVEL

## 2016-09-29 LAB — ETHANOL

## 2016-09-29 LAB — SALICYLATE LEVEL: Salicylate Lvl: <7 mg/dL (ref 2.8–30.0)

## 2016-09-29 MED ORDER — ACETAMINOPHEN 325 MG PO TABS: 650.0000 mg | ORAL_TABLET | ORAL | Status: DC | PRN

## 2016-09-29 MED ORDER — PREDNISONE 20 MG PO TABS: 60.0000 mg | ORAL_TABLET | Freq: Every day | ORAL | Status: DC

## 2016-09-29 MED ORDER — ALUM & MAG HYDROXIDE-SIMETH 200-200-20 MG/5ML PO SUSP: 30.0000 mL | Freq: Four times a day (QID) | ORAL | Status: DC | PRN

## 2016-09-29 MED ORDER — ONDANSETRON HCL 4 MG PO TABS: 4.0000 mg | ORAL_TABLET | Freq: Three times a day (TID) | ORAL | Status: DC | PRN

## 2016-09-29 MED ORDER — DOXYCYCLINE HYCLATE 100 MG PO TABS
100.0000 mg | ORAL_TABLET | Freq: Two times a day (BID) | ORAL | Status: DC
  Administered 2016-09-29 (×2): 100 mg via ORAL
  Filled 2016-09-29 (×2): qty 1

## 2016-09-29 NOTE — BH Assessment (Signed)
[facility] Assessment Progress Note  Per Thedore Mins, MD, this pt does not require psychiatric hospitalization at this time.  Pt presents under IVC initiated by pt's sister, which Dr Jannifer Franklin has rescinded.  Pt is to be discharged from [facility] with recommendation [facility] with [facility].  This has been included in pt's discharge instructions.  Pt's nurse, Diane, has been notified.  Doylene Canning, MA Triage Specialist [phone]

## 2016-09-29 NOTE — ED Notes (Signed)
Introduced self to patient. Pt oriented to unit expectations.  Assessed pt for:  A) Anxiety &/or agitation:  Pt is calm, cooperative, and pleasant. Denies SI/HI and AVH. Would like to be discharged.  S) Safety: Safety maintained with q-15-minute checks and hourly rounds by staff.  A) ADLs: Pt able to perform ADLs independently.  P) Pick-Up (room cleanliness):  Pt's room clean and free of clutter.

## 2016-09-29 NOTE — Discharge Instructions (Signed)
To help you maintain a sober lifestyle, a substance abuse treatment program may be beneficial to you.  Contact [facility] at your earliest opportunity to ask about enrolling in their program: ° °     [facility] °     [address] °     [place], [place] [address] °     [phone] °     New patients are seen at the walk-in clinic every Tuesday from 9:00 am - 12:00 pm °
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
## 2016-09-29 NOTE — ED Notes (Signed)
TTS assessment in progress. 

## 2016-09-29 NOTE — [facility] Suicide Risk Assessment (Signed)
Suicide Risk Assessment  Discharge Assessment   [facility] Discharge Suicide Risk Assessment   Principal Problem: Substance induced mood disorder [facility]) Discharge Diagnoses:  Patient Active Problem List   Diagnosis Date Noted  . Substance induced mood disorder (HCC) [F19.94] 09/29/2016    Priority: High    Total Time spent with patient: 45 minutes  Musculoskeletal: Strength & Muscle Tone: within normal limits Gait & Station: normal Patient leans: N/A  Psychiatric Specialty Exam:   Blood pressure 124/87, pulse 81, temperature 97.7 F (36.5 C), temperature source Oral, resp. rate 18, SpO2 96 %.There is no height or weight on file to calculate BMI.  General Appearance: Casual  Eye Contact::  Good  Speech:  Normal Rate  Volume:  Normal  Mood:  Anxious, mild  Affect:  Congruent  Thought Process:  Coherent and Descriptions of Associations: Intact  Orientation:  Full (Time, Place, and Person)  Thought Content:  WDL and Logical  Suicidal Thoughts:  No  Homicidal Thoughts:  No  Memory:  Immediate;   Good Recent;   Fair Remote;   Good  Judgement:  Fair  Insight:  Fair  Psychomotor Activity:  Normal  Concentration:  Good  Recall:  Good  Fund of Knowledge:Fair  Language: Good  Akathisia:  No  Handed:  Right  AIMS (if indicated):     Assets:  Housing Leisure Time Physical Health Resilience Social Support  Sleep:     Cognition: WNL  ADL's:  Intact   Mental Status Per Nursing Assessment::   On Admission:   IVC'd by his sister after an argument.  He was started on Prednisone and may have acted differently due to the side effects of it.  Today, he is clear and coherent without suicidal/homicidal ideations, hallucinations, or withdrawal symptoms.  He takes Adderall for ADHD and uses cannabis.  Stable for discharge.  Demographic Factors:  Male, Caucasian and Living alone  Loss Factors: NA  Historical Factors: NA  Risk Reduction Factors:   Sense of responsibility to family  and Positive social support  Continued Clinical Symptoms:  NOne  Cognitive Features That Contribute To Risk:  None    Suicide Risk:  Minimal: No identifiable suicidal ideation.  Patients presenting with no risk factors but with morbid ruminations; may be classified as minimal risk based on the severity of the depressive symptoms    Plan Of Care/Follow-up recommendations:  Activity:  as tolerated Diet:  heart healthy diet  Alexandria Shiflett, NP 09/29/2016, 10:21 AM

## 2016-09-29 NOTE — ED Provider Notes (Signed)
[facility]-EMERGENCY DEPT Provider Note   CSN: [id number] Arrival date & time: 09/28/16  2134     History   Chief Complaint Chief Complaint  Patient presents with  . Medical Clearance    HPI Dale Calderon is a 36 y.o. male who presents under involuntary commitment. He has a past medical history of substance abuse. The patient states he is recently diagnosed with pneumonia and started on prednisone. He states that he got into an argument with one of his sisters but he denies suicidal ideation, homicidal ideation or audiovisual hallucinations. IVC paperwork states that the patient has becoming increasingly paranoid. He thinks that his family is putting poison and an IV, and injecting him at night. IVC paperwork also states that the patient appears to be hallucinating. On my examination, the patient doesn't appear psychotic, however, answers most questions lucidly.  HPI  Past Medical History:  Diagnosis Date  . ADHD (attention deficit hyperactivity disorder)     There are no active problems to display for this patient.   No past surgical history on file.     Home Medications    Prior to Admission medications   Medication Sig Start Date End Date Taking? Authorizing Provider  albuterol (PROVENTIL HFA;VENTOLIN HFA) 108 (90 Base) MCG/ACT inhaler Inhale 2 puffs into the lungs every 4 (four) hours as needed for wheezing or shortness of breath (cough). 09/28/16  Yes Street, [place], PA-C  doxycycline (VIBRAMYCIN) 100 MG capsule Take 1 capsule (100 mg total) by mouth 2 (two) times daily. One po bid x 7 days STARTING TONIGHT (09/28/16) AT BEDTIME then continued twice daily until finished 09/28/16  Yes Street, [place], PA-C  ibuprofen (ADVIL,MOTRIN) 200 MG tablet Take 200-400 mg by mouth every 6 (six) hours as needed for moderate pain.   Yes [provider]  predniSONE (DELTASONE) 20 MG tablet 3 tabs po daily x 4 days STARTING 09/29/16 09/28/16  Yes Street, [place], PA-C  amoxicillin  (AMOXIL) 500 MG capsule Take 1 capsule (500 mg total) by mouth 3 (three) times daily. Patient not taking: Reported on 09/28/2016 06/20/15   Jaynie Crumble, PA-C  cephALEXin (KEFLEX) 500 MG capsule Take 1 capsule (500 mg total) by mouth 4 (four) times daily. For 10 days. Patient not taking: Reported on 09/28/2016 10/03/14   Mesner, Barbara Cower, MD  HYDROcodone-acetaminophen (NORCO/VICODIN) 5-325 MG per tablet Take 1-2 tablets by mouth every 6 (six) hours as needed. Patient not taking: Reported on 09/28/2016 10/03/14   Mesner, Barbara Cower, MD  ibuprofen (ADVIL,MOTRIN) 600 MG tablet Take 1 tablet (600 mg total) by mouth every 6 (six) hours as needed. Patient not taking: Reported on 09/28/2016 06/20/15   Jaynie Crumble, PA-C  meloxicam (MOBIC) 15 MG tablet Take 1 tablet (15 mg total) by mouth daily. Patient not taking: Reported on 09/28/2016 08/14/14   Ivery Quale, PA-C  predniSONE (DELTASONE) 10 MG tablet Take 2 tablets (20 mg total) by mouth 2 (two) times daily with a meal. Patient not taking: Reported on 09/28/2016 10/09/15   Janne Napoleon, NP    Family History No family history on file.  Social History Social History  Substance Use Topics  . Smoking status: Never Smoker  . Smokeless tobacco: Never Used  . Alcohol use Yes     Comment: occ     Allergies   Patient has no known allergies.   Review of Systems Review of Systems  Ten systems reviewed and are negative for acute change, except as noted in the HPI.  \ Physical Exam  Updated Vital Signs BP 120/64 (BP Location: Left Arm)   Pulse 78   Temp 97.8 F (36.6 C) (Oral)   Resp 16   SpO2 99%   Physical Exam  Constitutional: He appears well-developed and well-nourished. No distress.  HENT:  Head: Normocephalic and atraumatic.  Eyes: Conjunctivae are normal. No scleral icterus.  Neck: Normal range of motion. Neck supple.  Cardiovascular: Normal rate, regular rhythm and normal heart sounds.   Pulmonary/Chest: Effort normal and breath  sounds normal. No respiratory distress.  Abdominal: Soft. There is no tenderness.  Musculoskeletal: He exhibits no edema.  Neurological: He is alert.  Skin: Skin is warm and dry. He is not diaphoretic.  Psychiatric: His behavior is normal.  Nursing note and vitals reviewed.    ED Treatments / Results  Labs (all labs ordered are listed, but only abnormal results are displayed) Labs Reviewed  COMPREHENSIVE METABOLIC PANEL - Abnormal; Notable for the following:       Result Value   Potassium 3.4 (*)    Glucose, Bld 167 (*)    All other components within normal limits  CBC WITH DIFFERENTIAL/PLATELET - Abnormal; Notable for the following:    WBC 15.5 (*)    HCT 38.6 (*)    Neutro Abs 14.4 (*)    All other components within normal limits  ACETAMINOPHEN LEVEL - Abnormal; Notable for the following:    Acetaminophen (Tylenol), Serum <10 (*)    All other components within normal limits  ETHANOL  SALICYLATE LEVEL  RAPID URINE DRUG SCREEN, HOSP PERFORMED    EKG  EKG Interpretation None       Radiology Dg Chest 2 View  Result Date: 09/28/2016 CLINICAL DATA:  Shortness of breath with chest pain and cough. Fever. EXAM: CHEST  2 VIEW COMPARISON:  October 09, 2015 FINDINGS: There is airspace consolidation in the inferior lingula. There is mild interstitial prominence in the right mid and lower lung zones, a stable finding compared to prior study. Heart size and pulmonary vascular normal. No adenopathy. No bone lesions. IMPRESSION: Airspace opacity in the lingula inferiorly consistent with pneumonia. Interstitial prominence in the right mid and lower lung zones is stable and likely reflects chronic inflammatory type change. No adenopathy. Cardiac silhouette within normal limits. Electronically Signed   By: Bretta Bang III M.D.   On: 09/28/2016 13:30    Procedures Procedures (including critical care time)  Medications Ordered in ED Medications  ondansetron (ZOFRAN) tablet 4 mg  (not administered)  acetaminophen (TYLENOL) tablet 650 mg (not administered)  alum & mag hydroxide-simeth (MAALOX/MYLANTA) 200-200-20 MG/5ML suspension 30 mL (not administered)  doxycycline (VIBRA-TABS) tablet 100 mg (100 mg Oral Given 09/29/16 0038)  predniSONE (DELTASONE) tablet 60 mg (not administered)     Initial Impression / Assessment and Plan / ED Course  I have reviewed the triage vital signs and the nursing notes.  Pertinent labs & imaging results that were available during my care of the patient were reviewed by me and considered in my medical decision making (see chart for details).     Patient under involuntary commitment. TTS is seeing the patient. He is medically clear. We'll get psych evaluation in the morning.  Final Clinical Impressions(s) / ED Diagnoses   Final diagnoses:  Involuntary commitment    New Prescriptions New Prescriptions   No medications on file     Arthor Captain, PA-C 09/29/16 0153    Mancel Bale, MD 09/29/16 1556

## 2016-09-29 NOTE — ED Notes (Signed)
Pt discharged home. Discharged instructions read to pt who verbalized understanding. All belongings returned to pt who signed for same. Denies SI/HI, is not delusional and not responding to internal stimuli. Escorted pt to the ED exit.    

## 2016-09-29 NOTE — ED Notes (Signed)
Pt diagnosed with ADHD, presents with violent outbursts, threatening to kill family, mood swings and thinks his family is poisoning him as per family who took out IVC papers on pt.  Denies SI, HI or AVH.  Denies feeling hopeless.  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  A&O x 3, no distress noted, calm & cooperative.  Monitoring for safety, Q 15 min checks in effect.  Safety check for contraband completed, no items found.
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
## 2017-07-12 ENCOUNTER — Emergency Department (HOSPITAL_COMMUNITY)
Admission: EM | Admit: 2017-07-12 | Discharge: 2017-07-14 | Disposition: A | Discharge status: Home / Self Care | Payer: Self-pay | Attending: Physician Assistant | Admitting: Physician Assistant

## 2017-07-12 ENCOUNTER — Encounter (HOSPITAL_COMMUNITY): Payer: Self-pay | Admitting: Nurse Practitioner

## 2017-07-12 ENCOUNTER — Other Ambulatory Visit: Payer: Self-pay

## 2017-07-12 DIAGNOSIS — F29 Unspecified psychosis not due to a substance or known physiological condition: Secondary | ICD-10-CM

## 2017-07-12 DIAGNOSIS — R4689 Other symptoms and signs involving appearance and behavior: Secondary | ICD-10-CM

## 2017-07-12 DIAGNOSIS — F1994 Other psychoactive substance use, unspecified with psychoactive substance-induced mood disorder: Secondary | ICD-10-CM

## 2017-07-12 LAB — CBC WITH DIFFERENTIAL/PLATELET
Basophils Absolute: 0 10*3/uL (ref 0.0–0.1)
Basophils Relative: 0 %
Eosinophils Absolute: 0 10*3/uL (ref 0.0–0.7)
Eosinophils Relative: 0 %
HEMATOCRIT: 41.6 % (ref 39.0–52.0)
HEMOGLOBIN: 14.3 g/dL (ref 13.0–17.0)
LYMPHS PCT: 12 %
Lymphs Abs: 0.7 10*3/uL (ref 0.7–4.0)
MCH: 30.9 pg (ref 26.0–34.0)
MCHC: 34.4 g/dL (ref 30.0–36.0)
MCV: 89.8 fL (ref 78.0–100.0)
MONO ABS: 0.6 10*3/uL (ref 0.1–1.0)
MONOS PCT: 10 %
NEUTROS ABS: 4.8 10*3/uL (ref 1.7–7.7)
NEUTROS PCT: 78 %
Platelets: 203 10*3/uL (ref 150–400)
RBC: 4.63 MIL/uL (ref 4.22–5.81)
RDW: 12 % (ref 11.5–15.5)
WBC: 6.2 10*3/uL (ref 4.0–10.5)

## 2017-07-12 LAB — ACETAMINOPHEN LEVEL: Acetaminophen (Tylenol), Serum: <10 ug/mL — ABNORMAL LOW (ref 10–30)

## 2017-07-12 LAB — COMPREHENSIVE METABOLIC PANEL
ALBUMIN: 4.8 g/dL (ref 3.5–5.0)
ALT: 30 U/L (ref 17–63)
ANION GAP: 9 (ref 5–15)
AST: 27 U/L (ref 15–41)
Alkaline Phosphatase: 60 U/L (ref 38–126)
BUN: 14 mg/dL (ref 6–20)
CHLORIDE: 105 mmol/L (ref 101–111)
CO2: 27 mmol/L (ref 22–32)
Calcium: 9.5 mg/dL (ref 8.9–10.3)
Creatinine, Ser: 1 mg/dL (ref 0.61–1.24)
GFR calc Af Amer: >60 mL/min (ref >60)
GFR calc non Af Amer: >60 mL/min (ref >60)
GLUCOSE: 118 mg/dL — AB (ref 65–99)
POTASSIUM: 3.7 mmol/L (ref 3.5–5.1)
Sodium: 141 mmol/L (ref 135–145)
Total Bilirubin: 1.7 mg/dL — ABNORMAL HIGH (ref 0.3–1.2)
Total Protein: 7.5 g/dL (ref 6.5–8.1)

## 2017-07-12 LAB — SALICYLATE LEVEL: Salicylate Lvl: <7 mg/dL (ref 2.8–30.0)

## 2017-07-12 LAB — ETHANOL

## 2017-07-12 MED ORDER — DIPHENHYDRAMINE HCL 50 MG/ML IJ SOLN
50.0000 mg | Freq: Once | INTRAMUSCULAR | Status: AC
  Administered 2017-07-12: 50 mg via INTRAMUSCULAR
  Filled 2017-07-12: qty 1

## 2017-07-12 MED ORDER — ZIPRASIDONE MESYLATE 20 MG IM SOLR
20.0000 mg | Freq: Once | INTRAMUSCULAR | Status: AC
  Administered 2017-07-12: 20 mg via INTRAMUSCULAR
  Filled 2017-07-12: qty 20

## 2017-07-12 MED ORDER — LORAZEPAM 2 MG/ML IJ SOLN
2.0000 mg | Freq: Once | INTRAMUSCULAR | Status: AC
  Administered 2017-07-12: 2 mg via INTRAMUSCULAR
  Filled 2017-07-12: qty 1

## 2017-07-12 MED ORDER — DIPHENHYDRAMINE HCL 50 MG/ML IJ SOLN
50.0000 mg | Freq: Once | INTRAMUSCULAR | Status: AC
  Administered 2017-07-12: 50 mg via INTRAMUSCULAR

## 2017-07-12 MED ORDER — STERILE WATER FOR INJECTION IJ SOLN
INTRAMUSCULAR | Status: AC
  Administered 2017-07-12: 1.2 mL
  Filled 2017-07-12: qty 10

## 2017-07-12 MED ORDER — DIPHENHYDRAMINE HCL 50 MG/ML IJ SOLN
INTRAMUSCULAR | Status: AC
  Administered 2017-07-12: 50 mg via INTRAMUSCULAR
  Filled 2017-07-12: qty 1

## 2017-07-12 MED ORDER — HALOPERIDOL LACTATE 5 MG/ML IJ SOLN
5.0000 mg | Freq: Four times a day (QID) | INTRAMUSCULAR | Status: DC | PRN
  Administered 2017-07-12 (×2): 5 mg via INTRAMUSCULAR
  Filled 2017-07-12 (×2): qty 1

## 2017-07-12 NOTE — ED Notes (Signed)
   07/12/17 1142  Every 60 minutes (1 hour) & Post-release assessment  Pulse Rate 64  Resp 18  BP 106/67

## 2017-07-12 NOTE — ED Notes (Signed)
Pt awake, alert & responsive, no distress noted, calm & cooperative, watching TV at present.  Monitoring for safety, Q 15 min checks in effect.

## 2017-07-12 NOTE — ED Notes (Signed)
Bed: WA31 Expected date:  Expected time:  Means of arrival:  Comments: 

## 2017-07-12 NOTE — ED Notes (Signed)
Report given to [facility] RN 
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
## 2017-07-12 NOTE — ED Notes (Signed)
Pt still going into other rooms and appears confused.  He keeps trying to take garbage sacks from other patients.  Pt was reminded the importance of providing urine sample.
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
## 2017-07-12 NOTE — ED Notes (Signed)
Patient went into two male rooms.  After being told not to he went into one room twice.   Pt was escorted back to his room by security.

## 2017-07-12 NOTE — ED Notes (Signed)
Pt becoming more agitated, standing at the front desk, religiously preoccupied at present.

## 2017-07-12 NOTE — ED Notes (Signed)
Pt oriented to room and unit.  Pt seems very confused.  He is looking out a window that is not there.  His speech is garbled and pressured.  He was instructed to provide a urine specimen.  15 minute checks and video monitoring in place.

## 2017-07-12 NOTE — ED Notes (Signed)
Pt expressing a lot of emotion and wanting to go outside and feel the sun. Pt is redirectable with some effort.

## 2017-07-13 DIAGNOSIS — F29 Unspecified psychosis not due to a substance or known physiological condition: Secondary | ICD-10-CM | POA: Diagnosis present

## 2017-07-13 DIAGNOSIS — Z590 Homelessness: Secondary | ICD-10-CM

## 2017-07-13 DIAGNOSIS — Z560 Unemployment, unspecified: Secondary | ICD-10-CM

## 2017-07-13 LAB — RAPID URINE DRUG SCREEN, HOSP PERFORMED
AMPHETAMINES: NOT DETECTED
BARBITURATES: NOT DETECTED
Benzodiazepines: POSITIVE — AB
COCAINE: NOT DETECTED
OPIATES: NOT DETECTED
Tetrahydrocannabinol: POSITIVE — AB

## 2017-07-13 MED ORDER — OLANZAPINE 5 MG PO TABS
5.0000 mg | ORAL_TABLET | Freq: Two times a day (BID) | ORAL | Status: DC
  Administered 2017-07-13 – 2017-07-14 (×3): 5 mg via ORAL
  Filled 2017-07-13 (×3): qty 1

## 2017-07-13 MED ORDER — ZIPRASIDONE MESYLATE 20 MG IM SOLR: 20.0000 mg | Freq: Two times a day (BID) | INTRAMUSCULAR | Status: DC | PRN

## 2017-07-13 NOTE — Consult Note (Signed)
[facility] Psychiatry Consult   Reason for Consult:  Psychosis  Referring Physician:  EDP  Patient Identification: Dale Calderon MRN:  [id number] Principal Diagnosis: Psychosis [facility]) Diagnosis:   Patient Active Problem List   Diagnosis Date Noted  . Substance induced mood disorder [facility]) [F19.94] 09/29/2016    Total Time spent with patient: 30 minutes  Subjective:   Dale Calderon is a 37 y.o. male patient admitted with psychosis.  HPI:   Per chart review, patient was admitted with psychosis and agitation. On interview, Dale Calderon is disorganized in thought process. He reports delusional thoughts. He reports, "My father told me how to pray to the holy spirit and touched my heart." He reports that he has a lot going on. He denies SI, HI or AVH. He denies problems with sleep.   Past Psychiatric History: ADHD   Risk to Self: Suicidal Ideation: No Suicidal Intent: No Is patient at risk for suicide?: No Suicidal Plan?: No Access to Means: No What has been your use of drugs/alcohol within the last 12 months?: Methamphetamine, cocaine, THC Other Self Harm Risks: drug use Triggers for Past Attempts: Unknown Intentional Self Injurious Behavior: None Risk to Others: Homicidal Ideation: No Thoughts of Harm to Others: No Current Homicidal Intent: No Current Homicidal Plan: No Access to Homicidal Means: No Identified Victim: n/a History of harm to others?: No Assessment of Violence: None Noted Violent Behavior Description: none noted Does patient have access to weapons?: (UTA) Criminal Charges Pending?: (UTA) Does patient have a court date: (UTA) Prior Inpatient Therapy: Denies  Prior Outpatient Therapy: Denies   Past Medical History:  Past Medical History:  Diagnosis Date  . ADHD (attention deficit hyperactivity disorder)    History reviewed. No pertinent surgical history. Family History: History reviewed. No pertinent family history. Family Psychiatric  History: Unknown   Social History:  Social History   Substance and Sexual Activity  Alcohol Use Yes   Comment: occ     Social History   Substance and Sexual Activity  Drug Use Yes  . Types: Marijuana   Comment: opiates    Social History   Socioeconomic History  . Marital status: Single    Spouse name: Not on file  . Number of children: Not on file  . Years of education: Not on file  . Highest education level: Not on file  Occupational History  . Not on file  Social Needs  . Financial resource strain: Not on file  . Food insecurity:    Worry: Not on file    Inability: Not on file  . Transportation needs:    Medical: Not on file    Non-medical: Not on file  Tobacco Use  . Smoking status: Never Smoker  . Smokeless tobacco: Never Used  Substance and Sexual Activity  . Alcohol use: Yes    Comment: occ  . Drug use: Yes    Types: Marijuana    Comment: opiates  . Sexual activity: Not on file  Lifestyle  . Physical activity:    Days per week: Not on file    Minutes per session: Not on file  . Stress: Not on file  Relationships  . Social connections:    Talks on phone: Not on file    Gets together: Not on file    Attends religious service: Not on file    Active member of club or organization: Not on file    Attends meetings of clubs or organizations: Not on file  Relationship status: Not on file  Other Topics Concern  . Not on file  Social History Narrative   ** Merged History Encounter **       Additional Social History: He is single and has no children. He is homeless. He is unemployed. He reports regular marijuana use. He denies alcohol use.     Allergies:  No Known Allergies  Labs:  Results for orders placed or performed during the hospital encounter of 07/12/17 (from the past 48 hour(s))  Comprehensive metabolic panel     Status: Abnormal   Collection Time: 07/12/17  8:37 AM  Result Value Ref Range   Sodium 141 135 - 145 mmol/L   Potassium 3.7 3.5 - 5.1 mmol/L    Chloride 105 101 - 111 mmol/L   CO2 27 22 - 32 mmol/L   Glucose, Bld 118 (H) 65 - 99 mg/dL   BUN 14 6 - 20 mg/dL   Creatinine, Ser 1.00 0.61 - 1.24 mg/dL   Calcium 9.5 8.9 - 10.3 mg/dL   Total Protein 7.5 6.5 - 8.1 g/dL   Albumin 4.8 3.5 - 5.0 g/dL   AST 27 15 - 41 U/L   ALT 30 17 - 63 U/L   Alkaline Phosphatase 60 38 - 126 U/L   Total Bilirubin 1.7 (H) 0.3 - 1.2 mg/dL   GFR calc non Af Amer >60 >60 mL/min   GFR calc Af Amer >60 >60 mL/min    Comment: (NOTE) The eGFR has been calculated using the CKD EPI equation. This calculation has not been validated in all clinical situations. eGFR's persistently <60 mL/min signify possible Chronic Kidney Disease.    Anion gap 9 5 - 15    Comment: Performed at [facility], [place] [address], [place], [place] [address]  Ethanol     Status: None   Collection Time: 07/12/17  8:37 AM  Result Value Ref Range   Alcohol, Ethyl (B) <10 <10 mg/dL    Comment: (NOTE) Lowest detectable limit for serum alcohol is 10 mg/dL. For medical purposes only. Performed at [facility], [place] [address] [place], [place] [address]   Urine rapid drug screen (hosp performed)     Status: Abnormal   Collection Time: 07/12/17  8:37 AM  Result Value Ref Range   Opiates NONE DETECTED NONE DETECTED   Cocaine NONE DETECTED NONE DETECTED   Benzodiazepines POSITIVE (A) NONE DETECTED   Amphetamines NONE DETECTED NONE DETECTED   Tetrahydrocannabinol POSITIVE (A) NONE DETECTED   Barbiturates NONE DETECTED NONE DETECTED    Comment: (NOTE) DRUG SCREEN FOR MEDICAL PURPOSES ONLY.  IF CONFIRMATION IS NEEDED FOR ANY PURPOSE, NOTIFY LAB WITHIN 5 DAYS. LOWEST DETECTABLE LIMITS FOR URINE DRUG SCREEN Drug Class                     Cutoff (ng/mL) Amphetamine and metabolites    1000 Barbiturate and metabolites    200 Benzodiazepine                 741 Tricyclics and metabolites     300 Opiates and metabolites        300 Cocaine and  metabolites        300 THC                            50 Performed at [facility], [place] [address] [place], [place] [address]   CBC with  Diff     Status: None   Collection Time: 07/12/17  8:37 AM  Result Value Ref Range   WBC 6.2 4.0 - 10.5 K/uL   RBC 4.63 4.22 - 5.81 MIL/uL   Hemoglobin 14.3 13.0 - 17.0 g/dL   HCT 41.6 39.0 - 52.0 %   MCV 89.8 78.0 - 100.0 fL   MCH 30.9 26.0 - 34.0 pg   MCHC 34.4 30.0 - 36.0 g/dL   RDW 12.0 11.5 - 15.5 %   Platelets 203 150 - 400 K/uL   Neutrophils Relative % 78 %   Neutro Abs 4.8 1.7 - 7.7 K/uL   Lymphocytes Relative 12 %   Lymphs Abs 0.7 0.7 - 4.0 K/uL   Monocytes Relative 10 %   Monocytes Absolute 0.6 0.1 - 1.0 K/uL   Eosinophils Relative 0 %   Eosinophils Absolute 0.0 0.0 - 0.7 K/uL   Basophils Relative 0 %   Basophils Absolute 0.0 0.0 - 0.1 K/uL    Comment: Performed at [facility], [place] [address], [place], [place] [address]  Acetaminophen level     Status: Abnormal   Collection Time: 07/12/17  8:37 AM  Result Value Ref Range   Acetaminophen (Tylenol), Serum <10 (L) 10 - 30 ug/mL    Comment: (NOTE) Therapeutic concentrations vary significantly. A range of 10-30 ug/mL  may be an effective concentration for many patients. However, some  are best treated at concentrations outside of this range. Acetaminophen concentrations >150 ug/mL at 4 hours after ingestion  and >50 ug/mL at 12 hours after ingestion are often associated with  toxic reactions. Performed at [facility], [place] [address] [place], [place] [address]   Salicylate level     Status: None   Collection Time: 07/12/17  8:37 AM  Result Value Ref Range   Salicylate Lvl <2.4 2.8 - 30.0 mg/dL    Comment: Performed at [facility], [place] [address], [place], [place] [address]    Current Facility-Administered Medications  Medication Dose Route Frequency Provider Last Rate Last Dose  . haloperidol  lactate (HALDOL) injection 5 mg  5 mg Intramuscular Q6H PRN Patrecia Pour, NP   5 mg at 07/12/17 2317   No current outpatient medications on file.    Musculoskeletal: Strength & Muscle Tone: within normal limits Gait & Station: normal Patient leans: N/A  Psychiatric Specialty Exam: Physical Exam  Nursing note and vitals reviewed. Constitutional: He is oriented to person, place, and time. He appears well-developed and well-nourished.  HENT:  Head: Normocephalic and atraumatic.  Neck: Normal range of motion.  Respiratory: Effort normal.  Musculoskeletal: Normal range of motion.  Neurological: He is alert and oriented to person, place, and time.  Skin: No rash noted.  Psychiatric: His behavior is normal. His speech is rapid and/or pressured. Thought content is delusional. Cognition and memory are impaired. He expresses impulsivity.    Review of Systems  Psychiatric/Behavioral: Positive for substance abuse. Negative for hallucinations and suicidal ideas. The patient does not have insomnia.   All other systems reviewed and are negative.   Blood pressure 116/81, pulse 72, temperature 98.3 F (36.8 C), resp. rate 18, SpO2 98 %.There is no height or weight on file to calculate BMI.  General Appearance: Fairly Groomed, young, Caucasian male, wearing paper hospital scrubs with a ponytail and lying in bed. NAD.   Eye Contact:  Good  Speech:  Pressured  Volume:  Normal  Mood:  "I have a lot  going on."   Affect:  Elevated   Thought Process:  Disorganized and Descriptions of Associations: Tangential  Orientation:  Full (Time, Place, and Person)  Thought Content:  Delusions  Suicidal Thoughts:  No  Homicidal Thoughts:  No  Memory:  Immediate;   Fair Recent;   Fair Remote;   Fair  Judgement:  Impaired  Insight:  Lacking  Psychomotor Activity:  Normal  Concentration:  Concentration: Fair and Attention Span: Fair  Recall:  [employer] of Knowledge:  Fair  Language:  Good  Akathisia:   No  Handed:  Right  AIMS (if indicated):   N/A  Assets:  Communication Skills Desire for Improvement  ADL's:  Intact  Cognition:  Impaired due to psychiatric illness.   Sleep:   Okay   Assessment:  Dale Calderon is a 37 y.o. male who was admitted with psychosis. He continues to endorses delusional thoughts and is disorganized in thought process with rapid speech. He warrants inpatient psychiatric hospitalization for stabilization and treatment.   Treatment Plan Summary: Daily contact with patient to assess and evaluate symptoms and progress in treatment and Medication management  -Start Zyprexa 5 mg BID for psychosis.  -IM Geodon 20 mg BID PRN ordered for agitation.  -EKG reviewed and QTc 374. Please closely monitor when starting or increasing QTc prolonging agents.    Disposition: Recommend psychiatric Inpatient admission when medically cleared.  Faythe Dingwall, DO 07/13/2017 12:20 PM

## 2017-07-13 NOTE — ED Notes (Signed)
Today pt is alert and calm and cooperative. He said that he remembers having a dream that he as baby Jesus and realizes that he was delusional. States that he feels better today. He is alert and oriented. He says "Yes Mam" when you ask him something. At present he is attempting to get a urine sample for us.

## 2017-07-13 NOTE — ED Notes (Signed)
Pt sleeping at present, no distress noted, calm at present.  Monitoring for safety, Q 15 min checks in effect. 

## 2017-07-13 NOTE — [facility] Counselor (Signed)
Patient evaluated by Dr. Sharma Dale Calderon and Dale Plumakia Benno Brensinger, NP. Inpatient Treatment was recommended. No appropriate [facility] beds at this time. Patient [facility] to outside facilities including:, [facility],  [address], [facility], [address], [place], and [place] [facility].

## 2017-07-14 DIAGNOSIS — F1020 Alcohol dependence, uncomplicated: Secondary | ICD-10-CM

## 2017-07-14 DIAGNOSIS — R4587 Impulsiveness: Secondary | ICD-10-CM

## 2017-07-14 DIAGNOSIS — F22 Delusional disorders: Secondary | ICD-10-CM

## 2017-07-14 DIAGNOSIS — Z599 Problem related to housing and economic circumstances, unspecified: Secondary | ICD-10-CM
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
## 2017-07-14 NOTE — ED Notes (Signed)
Patient calm, cooperative, pleasant.  No SI, HI, or AVH.  Patient is still making religious references and is having some delusional thoughts.

## 2017-07-14 NOTE — [facility] Suicide Risk Assessment (Cosign Needed)
Suicide Risk Assessment  Discharge Assessment   [facility]   Principal Problem: Psychosis [facility](HCC) Discharge Diagnoses:  Patient Active Problem List   Diagnosis Date Noted  . Psychosis (HCC) [F29]   . Substance induced mood disorder (HCC) [F19.94] 09/29/2016    Total Time spent with patient: 30 minutes  Musculoskeletal: Strength & Muscle Tone: within normal limits Gait & Station: normal Patient leans: N/A   Psychiatric Specialty Exam: Physical Exam  Nursing note and vitals reviewed. Constitutional: He is oriented to person, place, and time. He appears well-developed and well-nourished.  HENT:  Head: Normocephalic and atraumatic.  Neck: Normal range of motion.  Respiratory: Effort normal.  Musculoskeletal: Normal range of motion.  Neurological: He is alert and oriented to person, place, and time.  Skin: No rash noted.  Psychiatric: His behavior is normal. His speech is rapid and/or pressured. Thought content is delusional. Cognition and memory are impaired. He expresses impulsivity.   Review of Systems  Psychiatric/Behavioral: Positive for substance abuse. Negative for hallucinations and suicidal ideas. The patient does not have insomnia.   All other systems reviewed and are negative.  Blood pressure (!) 116/93, pulse 90, temperature 98.5 F (36.9 C), temperature source Oral, resp. rate 20, SpO2 99 %.There is no height or weight on file to calculate BMI. General Appearance: Fairly Groomed, young, Caucasian male, wearing paper hospital scrubs with a ponytail and lying in bed. NAD.  Eye Contact:  Good Speech:  Pressured Volume:  Normal Mood:  "I have a lot going on."  Affect:  Elevated  Thought Process:  Coherent and Linear Orientation:  Full (Time, Place, and Person) Thought Content:  Logical Suicidal Thoughts:  No Homicidal Thoughts:  No Memory:  Immediate;   Good Recent;   Good Remote;   Fair Judgement:  Fair Insight:   Lacking Psychomotor Activity:  Normal Concentration:  Concentration: Fair and Attention Span: Fair Recall:  [employer] of Knowledge:  Fair Language:  Good Akathisia:  No Handed:  Right AIMS (if indicated):   N/A Assets:  Communication Skills Desire for Improvement ADL's:  Intact Cognition:  WNL  Sleep:   Okay   Mental Status Per Nursing Assessment::   On Admission:   Psychosis  Demographic Factors:  Male, Caucasian and Unemployed  Loss Factors: Financial problems/change in socioeconomic status  Historical Factors: Impulsivity  Risk Reduction Factors:   Sense of responsibility to family  Continued Clinical Symptoms:  Depression:   Comorbid alcohol abuse/dependence Impulsivity Alcohol/Substance Abuse/Dependencies  Cognitive Features That Contribute To Risk:  Closed-mindedness    Suicide Risk:  Minimal: No identifiable suicidal ideation.  Patients presenting with no risk factors but with morbid ruminations; may be classified as minimal risk based on the severity of the depressive symptoms    Plan Of Care/Follow-up recommendations:  Activity:  as tolerated Diet:  Heart Healthy  Laveda AbbeLaurie Britton Parks, NP 07/14/2017, 12:08 PM

## 2017-07-14 NOTE — Consult Note (Addendum)
[facility] Face-to-Face Psychiatry Consult   Reason for Consult:  Psychosis  Referring Physician:  EDP  Patient Identification: Dale Calderon MRN:  [id number] Principal Diagnosis: Psychosis [facility]) Diagnosis:   Patient Active Problem List   Diagnosis Date Noted  . Psychosis (HCC) [F29]   . Substance induced mood disorder [facility]) [F19.94] 09/29/2016    Total Time spent with patient: 30 minutes  Subjective:   Dale Calderon is a 37 y.o. male patient admitted with psychosis.  HPI:   Pt was seen and chart reviewed with treatment team and Dr Jannifer Franklin.  Pt denies suicidal/homicidal ideation, denies auditory/visual hallucinations and does not appear to be responding to internal stimuli.  Pt states he smoked some weed yesterday and it might have been laced with something. Pt's UDS positive for Benzos and THC, BAL negative. Pt stated he believes in God but does not think he is God. Pt states he lives alone in an apartment on property his family owns and his family helps him out. He denies problems with sleep. [facility] in  [facility] the past three days and has remained calm and cooperative and medication compliant.  Pt is stable and psychiatrically clear for discharge.   Past Psychiatric History: ADHD   Risk to Self: None Risk to Others: None Prior Inpatient Therapy: Denies  Prior Outpatient Therapy: Denies   Past Medical History:  Past Medical History:  Diagnosis Date  . ADHD (attention deficit hyperactivity disorder)    History reviewed. No pertinent surgical history. Family History: History reviewed. No pertinent family history. Family Psychiatric  History: Unknown  Social History:  Social History   Substance and Sexual Activity  Alcohol Use Yes   Comment: occ     Social History   Substance and Sexual Activity  Drug Use Yes  . Types: Marijuana   Comment: opiates    Social History   Socioeconomic History  . Marital status: Single    Spouse name: Not on file  . Number of  children: Not on file  . Years of education: Not on file  . Highest education level: Not on file  Occupational History  . Not on file  Social Needs  . Financial resource strain: Not on file  . Food insecurity:    Worry: Not on file    Inability: Not on file  . Transportation needs:    Medical: Not on file    Non-medical: Not on file  Tobacco Use  . Smoking status: Never Smoker  . Smokeless tobacco: Never Used  Substance and Sexual Activity  . Alcohol use: Yes    Comment: occ  . Drug use: Yes    Types: Marijuana    Comment: opiates  . Sexual activity: Not on file  Lifestyle  . Physical activity:    Days per week: Not on file    Minutes per session: Not on file  . Stress: Not on file  Relationships  . Social connections:    Talks on phone: Not on file    Gets together: Not on file    Attends religious service: Not on file    Active member of club or organization: Not on file    Attends meetings of clubs or organizations: Not on file    Relationship status: Not on file  Other Topics Concern  . Not on file  Social History Narrative   ** Merged History Encounter **       Additional Social History: He is single and has  no children. He is homeless. He is unemployed. He reports regular marijuana use. He denies alcohol use.     Allergies:  No Known Allergies  Labs:  No results found for this or any previous visit (from the past 48 hour(s)).  Current Facility-Administered Medications  Medication Dose Route Frequency Provider Last Rate Last Dose  . OLANZapine (ZYPREXA) tablet 5 mg  5 mg Oral BID Cherly Beachorman, Jacqueline J, DO   5 mg at 07/14/17 1033  . ziprasidone (GEODON) injection 20 mg  20 mg Intramuscular BID PRN Cherly BeachNorman, Jacqueline J, DO       No current outpatient medications on file.    Musculoskeletal: Strength & Muscle Tone: within normal limits Gait & Station: normal Patient leans: N/A  Psychiatric Specialty Exam: Physical Exam  Nursing note and vitals  reviewed. Constitutional: He is oriented to person, place, and time. He appears well-developed and well-nourished.  HENT:  Head: Normocephalic and atraumatic.  Neck: Normal range of motion.  Respiratory: Effort normal.  Musculoskeletal: Normal range of motion.  Neurological: He is alert and oriented to person, place, and time.  Skin: No rash noted.  Psychiatric: His behavior is normal. His speech is rapid and/or pressured. Thought content is delusional. Cognition and memory are impaired. He expresses impulsivity.    Review of Systems  Psychiatric/Behavioral: Positive for substance abuse. Negative for hallucinations and suicidal ideas. The patient does not have insomnia.   All other systems reviewed and are negative.   Blood pressure (!) 116/93, pulse 90, temperature 98.5 F (36.9 C), temperature source Oral, resp. rate 20, SpO2 99 %.There is no height or weight on file to calculate BMI.  General Appearance: Fairly Groomed, young, Caucasian male, wearing paper hospital scrubs with a ponytail and lying in bed. NAD.   Eye Contact:  Good  Speech:  Pressured  Volume:  Normal  Mood:  "I have a lot going on."   Affect:  Elevated   Thought Process:  Coherent and Linear  Orientation:  Full (Time, Place, and Person)  Thought Content:  Logical  Suicidal Thoughts:  No  Homicidal Thoughts:  No  Memory:  Immediate;   Good Recent;   Good Remote;   Fair  Judgement:  Fair  Insight:  Lacking  Psychomotor Activity:  Normal  Concentration:  Concentration: Fair and Attention Span: Fair  Recall:  [employer]  Fund of Knowledge:  Fair  Language:  Good  Akathisia:  No  Handed:  Right  AIMS (if indicated):   N/A  Assets:  Communication Skills Desire for Improvement  ADL's:  Intact  Cognition:  WNL   Sleep:   Okay    Treatment Plan Summary: Plan Psychosis [facility](HCC)  Discharge Home Take all medications as prescribed Avoid the use of alcohol and illicit drugs    Disposition: No evidence of imminent  risk to self or others at present.   Patient does not meet criteria for psychiatric inpatient admission. Supportive therapy provided about ongoing stressors. Discussed crisis plan, support from social network, calling 911, coming to the Emergency Department, and calling Suicide Hotline.  Laveda AbbeLaurie Britton Parks, NP 07/14/2017 11:59 AM Patient seen face-to-face for psychiatric evaluation, chart reviewed and case discussed with the physician extender and developed treatment plan. Reviewed the information documented and agree with the treatment plan. Thedore MinsMojeed Etheridge Geil, MD

## 2017-07-15 ENCOUNTER — Encounter (HOSPITAL_COMMUNITY): Payer: Self-pay | Admitting: Emergency Medicine

## 2017-07-15 ENCOUNTER — Emergency Department (HOSPITAL_COMMUNITY)
Admission: EM | Admit: 2017-07-15 | Discharge: 2017-07-16 | Disposition: A | Discharge status: Home / Self Care | Payer: Self-pay | Attending: Emergency Medicine | Admitting: Emergency Medicine

## 2017-07-15 DIAGNOSIS — F1210 Cannabis abuse, uncomplicated: Secondary | ICD-10-CM | POA: Insufficient documentation

## 2017-07-15 DIAGNOSIS — F419 Anxiety disorder, unspecified: Secondary | ICD-10-CM | POA: Insufficient documentation

## 2017-07-15 DIAGNOSIS — Z79899 Other long term (current) drug therapy: Secondary | ICD-10-CM | POA: Insufficient documentation

## 2017-07-15 DIAGNOSIS — F209 Schizophrenia, unspecified: Secondary | ICD-10-CM | POA: Insufficient documentation

## 2017-07-15 DIAGNOSIS — Z046 Encounter for general psychiatric examination, requested by authority: Secondary | ICD-10-CM | POA: Insufficient documentation

## 2017-07-15 LAB — COMPREHENSIVE METABOLIC PANEL
ALBUMIN: 4.4 g/dL (ref 3.5–5.0)
ALK PHOS: 57 U/L (ref 38–126)
ALT: 34 U/L (ref 17–63)
ANION GAP: 8 (ref 5–15)
AST: 35 U/L (ref 15–41)
BILIRUBIN TOTAL: 0.8 mg/dL (ref 0.3–1.2)
BUN: 16 mg/dL (ref 6–20)
CALCIUM: 8.9 mg/dL (ref 8.9–10.3)
CO2: 27 mmol/L (ref 22–32)
Chloride: 105 mmol/L (ref 101–111)
Creatinine, Ser: 1 mg/dL (ref 0.61–1.24)
GLUCOSE: 110 mg/dL — AB (ref 65–99)
POTASSIUM: 3.8 mmol/L (ref 3.5–5.1)
Sodium: 140 mmol/L (ref 135–145)
TOTAL PROTEIN: 6.9 g/dL (ref 6.5–8.1)

## 2017-07-15 LAB — ACETAMINOPHEN LEVEL: Acetaminophen (Tylenol), Serum: <10 ug/mL — ABNORMAL LOW (ref 10–30)

## 2017-07-15 LAB — CBC
HEMATOCRIT: 41.3 % (ref 39.0–52.0)
Hemoglobin: 14 g/dL (ref 13.0–17.0)
MCH: 30.6 pg (ref 26.0–34.0)
MCHC: 33.9 g/dL (ref 30.0–36.0)
MCV: 90.2 fL (ref 78.0–100.0)
PLATELETS: 230 10*3/uL (ref 150–400)
RBC: 4.58 MIL/uL (ref 4.22–5.81)
RDW: 11.9 % (ref 11.5–15.5)
WBC: 6.2 10*3/uL (ref 4.0–10.5)

## 2017-07-15 LAB — URINALYSIS, ROUTINE W REFLEX MICROSCOPIC
BILIRUBIN URINE: NEGATIVE
Glucose, UA: NEGATIVE mg/dL
Hgb urine dipstick: NEGATIVE
KETONES UR: NEGATIVE mg/dL
Leukocytes, UA: NEGATIVE
Nitrite: NEGATIVE
PH: 7 (ref 5.0–8.0)
PROTEIN: NEGATIVE mg/dL
Specific Gravity, Urine: 1.006 (ref 1.005–1.030)

## 2017-07-15 LAB — ETHANOL

## 2017-07-15 LAB — SALICYLATE LEVEL

## 2017-07-15 NOTE — ED Notes (Signed)
Amy, sister, [phone] cell, or work [phone]. Please call with plan of care for patient. She feels that once he is released that he is not safe to go home.

## 2017-07-15 NOTE — ED Provider Notes (Signed)
[facility]-EMERGENCY DEPT Provider Note   CSN: [id number] Arrival date & time: 07/15/17  1604     History   Chief Complaint Chief Complaint  Patient presents with  . IVC    HPI RACE LATOUR is a 37 y.o. male with history of psychosis and substance induced mood disorder as well as ADHD presents for evaluation under IVC.  His sister filled out IVC paperwork which states that the patient "has not been formally diagnosed because he refuses to go seek help ".  Paperwork further states that he is not eating sleeping or attending to personal hygiene and believes that he is [employer].  He apparently believes that there are messages meant for him through the television and that he is being watched at all times by drones in the sky.  Additionally he drives down the road with his eyes closed because he believes he is [employer] and when in public jumps on people's cars and parking lots.  When I speak to the patient he is aware of who he is.  He tells me that his sister "thought I should get checked out.  I am not sure why I am here ".  He denies any recent illnesses, fevers, chills, suicidal ideation, homicidal ideation, or auditory or visual hallucinations.  He appears somewhat anxious.  He endorses marijuana use but denies any other recreational drug use.  Chart review shows that the patient was evaluated by TTS and a psychiatrist yesterday and was found to be in no apparent distress.   The history is provided by the patient.    Past Medical History:  Diagnosis Date  . ADHD (attention deficit hyperactivity disorder)     Patient Active Problem List   Diagnosis Date Noted  . Psychosis (HCC)   . Substance induced mood disorder (HCC) 09/29/2016    History reviewed. No pertinent surgical history.      Home Medications    Prior to Admission medications   Medication Sig Start Date End Date Taking? Authorizing Provider  albuterol (PROVENTIL HFA;VENTOLIN HFA)  108 (90 Base) MCG/ACT inhaler Inhale 2 puffs into the lungs every 6 (six) hours as needed for wheezing or shortness of breath.   Yes [provider]  amphetamine-dextroamphetamine (ADDERALL) 30 MG tablet Take 15 mg by mouth 2 (two) times daily.  07/05/17  Yes [provider]  buprenorphine-naloxone (SUBOXONE) 8-2 mg SUBL SL tablet Place 0.25 tablets under the tongue daily.   Yes [provider]    Family History No family history on file.  Social History Social History   Tobacco Use  . Smoking status: Never Smoker  . Smokeless tobacco: Never Used  Substance Use Topics  . Alcohol use: Yes    Comment: occ  . Drug use: Yes    Types: Marijuana    Comment: opiates     Allergies   Patient has no known allergies.   Review of Systems Review of Systems  Constitutional: Negative for chills and fever.  Psychiatric/Behavioral: Positive for hallucinations. The patient is nervous/anxious and is hyperactive.   All other systems reviewed and are negative.    Physical Exam Updated Vital Signs BP 131/83 (BP Location: Right Arm)   Pulse 62   Temp 98.2 F (36.8 C) (Oral)   Resp 16   SpO2 100%   Physical Exam  Constitutional: He appears well-developed and well-nourished. No distress.  Standing up, pacing frequently.  Wearing purple paper scrubs.  Hair is somewhat disheveled in appearance.  HENT:  Head: Normocephalic and atraumatic.  Eyes: Conjunctivae are normal. Right eye exhibits no discharge. Left eye exhibits no discharge.  Neck: No JVD present. No tracheal deviation present.  Cardiovascular: Normal rate, regular rhythm and normal heart sounds.  Pulmonary/Chest: Effort normal and breath sounds normal.  Abdominal: Soft. Bowel sounds are normal. He exhibits no distension. There is no tenderness. There is no guarding.  Musculoskeletal: He exhibits no edema.  Moves extremities spontaneously  Neurological: He is alert. No cranial nerve deficit. He exhibits  normal muscle tone.  Fluent speech with no evidence of dysarthria or aphasia, no facial droop.  Ambulatory without difficulty.  Skin: Skin is warm and dry. No erythema.  Psychiatric: His mood appears anxious. His speech is rapid and/or pressured.  Nursing note and vitals reviewed.    ED Treatments / Results  Labs (all labs ordered are listed, but only abnormal results are displayed) Labs Reviewed  COMPREHENSIVE METABOLIC PANEL - Abnormal; Notable for the following components:      Result Value   Glucose, Bld 110 (*)    All other components within normal limits  ACETAMINOPHEN LEVEL - Abnormal; Notable for the following components:   Acetaminophen (Tylenol), Serum <10 (*)    All other components within normal limits  URINALYSIS, ROUTINE W REFLEX MICROSCOPIC - Abnormal; Notable for the following components:   Color, Urine STRAW (*)    All other components within normal limits  ETHANOL  SALICYLATE LEVEL  CBC  RAPID URINE DRUG SCREEN, HOSP PERFORMED    EKG None  Radiology No results found.  Procedures Procedures (including critical care time)  Medications Ordered in ED Medications - No data to display   Initial Impression / Assessment and Plan / ED Course  I have reviewed the triage vital signs and the nursing notes.  Pertinent labs & imaging results that were available during my care of the patient were reviewed by me and considered in my medical decision making (see chart for details).     Patient presents under IVC for religious perseverations and thoughts that he is [employer].  He is afebrile, vital signs are stable.  He is nontoxic in appearance.  He exhibits rapid pressured speech and appears to have an elevated mood.  Physical examination and lab work reviewed by me are reassuring.  He is medically clear for TTS evaluation at this time.  Final Clinical Impressions(s) / ED Diagnoses   Final diagnoses:  Involuntary commitment    ED Discharge Orders    None         Bennye AlmFawze, Dalena Plantz A, PA-C 07/15/17 2309    Tegeler, Canary Brimhristopher J, MD 07/22/17 [phone]

## 2017-07-15 NOTE — ED Notes (Signed)
Pt signed authorization for release of information for several family members, including sister, Sharyne Richtersmy Leather, at [phone]

## 2017-07-15 NOTE — BH Assessment (Addendum)
Assessment Note  Dale Calderon A Palla is an 37 y.o. male, who [facility] Clinician asked the pt, "what brought you to the hospital?" Pt reported, "my sister wanted me to get checked out." Pt reported, "when do I get to go home." Pt denies, SI, HI, AVH, self-injurious behaviors and access to weapons.    Pt was IVC'd by his sister. Per IVC paperwork: "Dale Calderon has not been formally diagnosed because he refuses to go seek help. He is not eating sleeping or tending to personal hygiene, Respondent believes that he is [employer]. He is experiencing hallucinations and hearing voices.The respondent apparently believes that there are messages meant for him through the television. He thinks he is being watched at all times by drones in the sky. The respondent drives down the road with his eyes closed because he believes he is [employer]. While in public jumps on people's cars and parking lot. He grabs people that are walking by to pray from them."   Clinician contacted, pt's sister to obtain additional information. Pt's sister reported, the pt was in the hospital last Thursday (07/12/2017) and discharged Saturday (07/15/2017), for psychosis. Pt's sister reported, the pt's behaviors began changing about a year ago however last week he is not eating, sleeping or taking care of hygiene. Pt's sister reported, last week the pt was cleaning his face I na mud puddle. Pt's sister reported, the pt showed scars on his body where he died on the cross and he is sent here to die again. Pt's sister reported, the pt is paranoid thinking people are trying to poison him. Pt's sister reported, the pt does not have electronic devices including a phone, tablet or gaming system, because he thinks he's being watched. Pt's sister reported, the pt told their dad that he pooped an egg and a spider came out of his mouth.   Pt denies abuse. Pt reported, using his marijuana pen, earlier today. Pt's UDS is  positive for benzodiazepines and marijuana. Pt denies, being linked to OPT resources (medication management and/or counseling.) [facility] reported, [facility].   Pt presents quiet/awake in scrubs with logical/coherent speech. Pt's eye contact was fair. Pt's mood was pleasant. Pt's affect was congruent with mood. Pt's thought process was coherent/relevant. Pt's judgement was partial. Pt was oriented x4. Pt's concentration was normal. Pt's insight was fair. Pt's impulse control was poor. Pt reported, [facility] he could contract for safety. Pt's sister reported, she does not feel the pt is safet outside of the hospital.   Diagnosis: F20.9 Schizophrenia.                    F12.20 Cannabis use Disorder, severe  Past Medical History:  Past Medical History:  Diagnosis Date  . ADHD (attention deficit hyperactivity disorder)     History reviewed. No pertinent surgical history.  Family History: No family history on file.  Social History:  reports that he has never smoked. He has never used smokeless tobacco. He reports that he drinks alcohol. He reports that he has current or past drug history. Drug: Marijuana.  Additional Social History:  Alcohol / Drug Use Pain Medications: See MAR Prescriptions: See MAR Over the Counter: See MAR History of alcohol / drug use?: Yes Substance #1 Name of Substance 1: Benzodiazepines. 1 - Age of First Use: UTA 1 - Amount (size/oz): Pt's UDS is psotive for Benzodiazepines. 1 - Frequency: UTA  1 - Duration: UTA 1 - Last Use / Amount: UTA Substance #2 Name of Substance 2: Marijuana 2 - Age of First Use: UTA 2 - Amount (size/oz): Pt reported, using his marijuana pen, earlier today. 2 - Frequency: UTA 2 - Duration: UTA 2 - Last Use / Amount: Pt reproted, earlier today.   CIWA: CIWA-Ar BP: 131/83 Pulse Rate: 62 COWS:    Allergies: No Known Allergies  Home Medications:  (Not in a hospital admission)  OB/GYN  Status:  No LMP for male patient.  General Assessment Data [facility]: [facility] TTS Assessment: In system Is this a Tele or Face-to-Face Assessment?: Face-to-Face Is this an Initial Assessment or a Re-assessment for this encounter?: Initial Assessment Marital status: Single Living Arrangements: Alone Can pt return to current living arrangement?: No Admission Status: Involuntary Referral Source: Self/Family/Friend Insurance type: Self-pay.      Crisis Care Plan Living Arrangements: Alone Legal Guardian: Other:(Self. ) Name of Psychiatrist: NA Name of Therapist: NA  Education Status Is patient currently in school?: No Is the patient employed, unemployed or receiving disability?: Unemployed  Risk to self with the past 6 months Suicidal Ideation: No(Pt denies. ) Has patient been a risk to self within the past 6 months prior to admission? : No Suicidal Intent: No Has patient had any suicidal intent within the past 6 months prior to admission? : No Is patient at risk for suicide?: No Suicidal Plan?: No Has patient had any suicidal plan within the past 6 months prior to admission? : No Access to Means: No What has been your use of drugs/alcohol within the last 12 months?: Benzodiazepines and marijuana. Previous Attempts/Gestures: No How many times?: 0 Other Self Harm Risks: NA Triggers for Past Attempts: None known Intentional Self Injurious Behavior: None(Pt denies. ) Family Suicide History: Yes(Brother on 09/25/1998.) Recent stressful life event(s): Other (Comment)(Pt denies current stressors. ) Persecutory voices/beliefs?: No Depression: No(Pt denies. ) Depression Symptoms: (Pt denies. ) Substance abuse history and/or treatment for substance abuse?: Yes Suicide prevention information given to non-admitted patients: Not applicable  Risk to Others within the past 6 months Homicidal Ideation: No(Pt denies. ) Does patient have any lifetime risk of violence toward  others beyond the six months prior to admission? : No(Pt denies. ) Thoughts of Harm to Others: No Current Homicidal Intent: No Current Homicidal Plan: No Access to Homicidal Means: No Identified Victim: NA History of harm to others?: No Assessment of Violence: None Noted Violent Behavior Description: NA Does patient have access to weapons?: No(Pt denies. ) Criminal Charges Pending?: No Does patient have a court date: No Is patient on probation?: No  Psychosis Hallucinations: Auditory, Visual Delusions: Grandiose  Mental Status Report Appearance/Hygiene: In scrubs Eye Contact: Fair Motor Activity: Unremarkable Speech: Logical/coherent Level of Consciousness: Quiet/awake Mood: Pleasant Affect: Other (Comment)(congruent with mood. ) Anxiety Level: Minimal Thought Processes: Coherent, Relevant Judgement: Partial Orientation: Person, Place, Time, Situation Obsessive Compulsive Thoughts/Behaviors: None  Cognitive Functioning Concentration: Normal Memory: Recent Intact Is patient IDD: No Is patient DD?: No Insight: Fair Impulse Control: Poor Appetite: Fair Sleep: Decreased Total Hours of Sleep: (Pt reported, 7-8 hours. ) Vegetative Symptoms: Unable to Assess  ADLScreening [facility] Assessment Services) Patient's cognitive ability adequate to safely complete daily activities?: Yes Patient able to express need for assistance with ADLs?: Yes Independently performs ADLs?: Yes (appropriate for developmental age)  Prior Inpatient Therapy Prior Inpatient Therapy: Yes Prior Therapy Dates: 2016 Prior Therapy Facilty/Provider(s): Pt reported, going to [facility] for [facility] days.  Reason for Treatment: Substance use.   Prior Outpatient Therapy Prior Outpatient Therapy: No Does patient have an ACCT team?: No Does patient have Intensive In-House Services?  : No Does patient have [facility] services? : No Does patient have P4CC services?: No  ADL Screening (condition at time of  admission) Patient's cognitive ability adequate to safely complete daily activities?: Yes Is the patient deaf or have difficulty hearing?: No Does the patient have difficulty seeing, even when wearing glasses/contacts?: No Does the patient have difficulty concentrating, remembering, or making decisions?: No Patient able to express need for assistance with ADLs?: Yes Does the patient have difficulty dressing or bathing?: No Independently performs ADLs?: Yes (appropriate for developmental age) Does the patient have difficulty walking or climbing stairs?: No Weakness of Legs: None Weakness of Arms/Hands: None  Home Assistive Devices/Equipment Home Assistive Devices/Equipment: None    Abuse/Neglect Assessment (Assessment to be complete while patient is alone) Abuse/Neglect Assessment Can Be Completed: Yes Physical Abuse: Denies(Pt denies. ) Verbal Abuse: Denies(Pt denies. ) Sexual Abuse: Denies(Pt denies.) Exploitation of patient/patient's resources: Denies(Pt denies. ) Self-Neglect: Denies(Pt denies. )     Advance Directives (For Healthcare) Does Patient Have a Medical Advance Directive?: No Would patient like information on creating a medical advance directive?: No - Patient declined    Additional Information 1:1 In Past 12 Months?: No CIRT Risk: No Elopement Risk: No Does patient have medical clearance?: Yes     Disposition: Donell Sievert, PA recommends inpatient treatment. Disposition discussed with Dahlia Client, PA and Marcelino Duster, RN. TTS to seek placement.    Disposition Initial Assessment Completed for this Encounter: Yes Disposition of Patient: (inpatient treatment.) Mode of transportation if patient is discharged?: N/A  On Site Evaluation by:  Redmond Pulling, MS, LPC, CRC. Reviewed with Physician:  Donell Sievert, PA.  Redmond Pulling 07/16/2017 1:23 AM   Redmond Pulling, MS, LPC, CRC Triage Specialist [phone]

## 2017-07-15 NOTE — ED Notes (Signed)
Bed: [facility] Expected date:  Expected time:  Means of arrival:  Comments: 

## 2017-07-15 NOTE — ED Notes (Signed)
Introduced self to pt and oriented him to unit. He denied SI/HI/AVH. "I've never had thoughts of harming myself in my life." Said same for harming others. Pt presents with elevated mood. Contracts for safety.

## 2017-07-15 NOTE — ED Notes (Signed)
UA collected and sent to lab.

## 2017-07-15 NOTE — ED Triage Notes (Signed)
[place] [facility] [place] [facility] [place] papers taken out by pt's sister that state: "Respondent Dale Calderon has not been formally diagnosed because he refuses to go seek help. He isn't eating, sleeping, or tending to personal hygiene. Respondent believes that he is [employer]. He is experiencing hallucinations and hearing voices. The respondent believes that there is messages meant for him through the television. He thinks that he is being watched at all times by dromes in the sky. The respondent drives down the road with his eyes closed because he believes he is [employer]. While in public he jumps on peoples cars in parking lot. He grabs people that are walking by to pray for them."

## 2017-07-16 DIAGNOSIS — Z046 Encounter for general psychiatric examination, requested by authority: Secondary | ICD-10-CM

## 2017-07-16 LAB — RAPID URINE DRUG SCREEN, HOSP PERFORMED
Amphetamines: NOT DETECTED
Barbiturates: NOT DETECTED
Benzodiazepines: NOT DETECTED
COCAINE: NOT DETECTED
OPIATES: NOT DETECTED
TETRAHYDROCANNABINOL: POSITIVE — AB

## 2017-07-16 NOTE — ED Notes (Signed)
Pt d/c home per MD order. Discharge summary reviewed with pt. Pt verbalizes understanding. Pt denies SI/HI/AVH. Personal property returned. Pt signed e-signature. Ambulatory off unit with [facility].

## 2017-07-16 NOTE — BHH Counselor (Signed)
Pt's sister request a call if the pt is discharged and/or a change to his treatment.   Sharyne RichtersAmy Remo [phone].   Redmond Pullingreylese D Octavis Sheeler, MS, [facility], [facility] Triage Specialist [phone]

## 2017-07-16 NOTE — BHH Suicide Risk Assessment (Signed)
Suicide Risk Assessment  Discharge Assessment   [facility]   Principal Problem: Involuntary commitment Discharge Diagnoses:  Patient Active Problem List   Diagnosis Date Noted  . Involuntary commitment [Z04.6]   . Psychosis (HCC) [F29]   . Substance induced mood disorder [facility](HCC) [F19.94] 09/29/2016   Pt was seen and chart reviewed with treatment team and dr Sharma CovertNorman. Pt was admitted under IVC, placed by his sister in an attempt to force him to get the mental health help she believes he needs.  Pt denies suicidal/homicidal ideation, denies auditory/visual hallucinations and does not appear to be responding to internal stimuli. Pt also denies the information in the IVC and stated he does not understand why his sister wants him in the hospital. Pt's UDS and BAL are negative and Pt has been calm and cooperative while in the [facility]. Pt is stable and psychiatrically clear for discharge.   Total Time spent with patient: 45 minutes  Musculoskeletal: Strength & Muscle Tone: within normal limits Gait & Station: normal Patient leans: N/A  Psychiatric Specialty Exam:   Blood pressure 127/89, pulse 73, temperature 98.5 F (36.9 C), temperature source Oral, resp. rate 16, SpO2 100 %.There is no height or weight on file to calculate BMI.  General Appearance: Casual  Eye Contact::  Good  Speech:  Clear and Coherent and Normal Rate409  Volume:  Normal  Mood:  Euthymic  Affect:  Congruent  Thought Process:  Coherent, Goal Directed and Linear  Orientation:  Full (Time, Place, and Person)  Thought Content:  Logical  Suicidal Thoughts:  No  Homicidal Thoughts:  No  Memory:  Immediate;   Good Recent;   Good Remote;   Fair  Judgement:  Fair  Insight:  Fair  Psychomotor Activity:  Normal  Concentration:  Good  Recall:  Good  Fund of Knowledge:Good  Language: Good  Akathisia:  No  Handed:  Right  AIMS (if indicated):     Assets:  Medical laboratory scientific officerCommunication Skills Financial  Resources/Insurance Housing Physical Health Social Support  Sleep:     Cognition: WNL  ADL's:  Intact   Mental Status Per Nursing Assessment::   On Admission:     Demographic Factors:  Male, Caucasian and Unemployed  Loss Factors: Financial problems/change in socioeconomic status  Historical Factors: Impulsivity  Risk Reduction Factors:   Sense of responsibility to family  Continued Clinical Symptoms:  Depression:   Impulsivity  Cognitive Features That Contribute To Risk:  Closed-mindedness    Suicide Risk:  Minimal: No identifiable suicidal ideation.  Patients presenting with no risk factors but with morbid ruminations; may be classified as minimal risk based on the severity of the depressive symptoms    Plan Of Care/Follow-up recommendations:  Activity:  as tolerated Diet:  Heart healthy  Laveda AbbeLaurie Britton Ralphie Lovelady, NP 07/16/2017, 2:10 PM

## 2017-07-16 NOTE — Progress Notes (Signed)
Pt in dayroom interacting with other patients.  Pt is cooperative and friendly and denies SI, HI and AVH.  Pt contracts for safety. Pt c/o of not being able to urinate and is in the bathroom trying several times.  Pt advised that he may need to have I&O cath if he becomes distended.  Pt becomes very motivated to urinate and provides a UA.  Pt sts he does not have a problem urinating anymore.  Sample is straw colored and clear. Pt in room sleeping at this time.

## 2017-07-16 NOTE — BH Assessment (Signed)
[facility]  Per Juanetta BeetsJacqueline Norman, DO, this pt does not require psychiatric hospitalization at this time.  [facility]  He does not want any outpatient referrals.  Pt's nurse, Kendal Hymendie, has been notified.  Doylene Canninghomas Talon Regala, MA Triage Specialist [phone]

## 2019-07-10 ENCOUNTER — Other Ambulatory Visit: Payer: Self-pay

## 2019-07-10 ENCOUNTER — Ambulatory Visit (INDEPENDENT_AMBULATORY_CARE_PROVIDER_SITE_OTHER): Payer: No Payment, Other | Admitting: Psychiatry

## 2019-07-10 ENCOUNTER — Ambulatory Visit (INDEPENDENT_AMBULATORY_CARE_PROVIDER_SITE_OTHER): Payer: No Payment, Other | Admitting: *Deleted

## 2019-07-10 ENCOUNTER — Encounter (HOSPITAL_COMMUNITY): Payer: Self-pay | Admitting: Psychiatry

## 2019-07-10 VITALS — BP 111/82 | HR 80 | Resp 18 | Ht >= 80 in | Wt 228.0 lb

## 2019-07-10 DIAGNOSIS — F250 Schizoaffective disorder, bipolar type: Secondary | ICD-10-CM

## 2019-07-10 DIAGNOSIS — F900 Attention-deficit hyperactivity disorder, predominantly inattentive type: Secondary | ICD-10-CM

## 2019-07-10 DIAGNOSIS — F1910 Other psychoactive substance abuse, uncomplicated: Secondary | ICD-10-CM | POA: Diagnosis not present

## 2019-07-10 MED ORDER — ARIPIPRAZOLE ER 400 MG IM PRSY
400.0000 mg | PREFILLED_SYRINGE | INTRAMUSCULAR | Status: DC
  Administered 2019-07-10 – 2019-10-09 (×3): 400 mg via INTRAMUSCULAR

## 2019-07-10 NOTE — Progress Notes (Signed)
Psychiatric Initial Adult Assessment   Patient Identification: Dale Calderon MRN:  [id number] Date of Evaluation:  07/10/2019   Referral Source: Vesta Mixer  Chief Complaint:   " I am doing good. I am here for my shot."  Visit Diagnosis:    ICD-10-CM   1. Schizoaffective disorder, bipolar type (HCC)  F25.0   2. Attention deficit hyperactivity disorder (ADHD), predominantly inattentive type  F90.0   3. Polysubstance abuse (HCC) in the past  F19.10     History of Present Illness: This is a 39 year old male with history of schizoaffective disorder, ADHD, polysubstance abuse in remission now seen for evaluation.  He was being followed at [facility] for psychiatry care and is care was transferred to this clinic.  Patient reported that he has been doing well on his current regimen.  He stated that he only takes Adderall 30 mg twice daily which is prescribed to him by Dr. Andrey Campanile.  He informed that other than Adderall is not taking any scheduled old medications.  He informed that he gets Abilify Maintena 200 mg every monthly.  He stated that he got his last injection on May 3 at [facility] clinic. Patient stated that the 400 mg dose was too strong for him and that is why he takes only half of the dose.  He reported having a long history of mood swings, aggressive outbursts, auditory and visual hallucinations as well as paranoid ideations.  He informed that he got on the Abilify Maintena injection about a year ago and he has been stable since then.  He informed that he used to use marijuana, cocaine and other drugs including opioids however he has stayed away from all this for the past year or so.  He informed that currently living in an apartment that is owned by his father.  His family helps him with his basic bills. He is unemployed and is trying to get disability.  He stated that he just days in his apartment and watches TV most of the day. He denied any current difficulty with sleep.  He denied any pertaining  to his appetite. He denied current auditory or visual hallucinations.  He denied any ongoing paranoid ideations or delusions.  He denied any suicidal or homicidal ideations at present.  He denied any symptoms suggestive of depression, hypomania or mania at present. He denied any symptom suggestive of PTSD.   Past Psychiatric History: Schizoaffective disorder, polysubstance abuse, ADHD.  He has had several presentations to the emergency room for various reasons including aggression towards family members, suicidal ideations.  His last emergency room visit was back in June 2019.  Previous Psychotropic Medications: Yes   Substance Abuse History in the last 12 months:  No.  Consequences of Substance Abuse: NA  Past Medical History:  Past Medical History:  Diagnosis Date  . ADHD (attention deficit hyperactivity disorder)    No past surgical history on file.  Family Psychiatric History: Denied  Family History: No family history on file.  Social History:   Social History   Socioeconomic History  . Marital status: Single    Spouse name: Not on file  . Number of children: Not on file  . Years of education: Not on file  . Highest education level: Not on file  Occupational History  . Not on file  Tobacco Use  . Smoking status: Never Smoker  . Smokeless tobacco: Never Used  Substance and Sexual Activity  . Alcohol use: Yes    Comment: occ  . Drug  use: Yes    Types: Marijuana    Comment: opiates  . Sexual activity: Not on file  Other Topics Concern  . Not on file  Social History Narrative   ** Merged History Encounter **       Social Determinants of Health   Financial Resource Strain:   . Difficulty of Paying Living Expenses:   Food Insecurity:   . Worried About Charity fundraiser in the Last Year:   . Arboriculturist in the Last Year:   Transportation Needs:   . Film/video editor (Medical):   Marland Kitchen Lack of Transportation (Non-Medical):   Physical Activity:   .  Days of Exercise per Week:   . Minutes of Exercise per Session:   Stress:   . Feeling of Stress :   Social Connections:   . Frequency of Communication with Friends and Family:   . Frequency of Social Gatherings with Friends and Family:   . Attends Religious Services:   . Active Member of Clubs or Organizations:   . Attends Archivist Meetings:   Marland Kitchen Marital Status:     Additional Social History: Currently lives by himself, unemployed.  Family helping with housing and basic bills.  Allergies:  No Known Allergies  Metabolic Disorder Labs: No results found for: HGBA1C, MPG No results found for: PROLACTIN No results found for: CHOL, TRIG, HDL, CHOLHDL, VLDL, LDLCALC No results found for: TSH  Therapeutic Level Labs: No results found for: LITHIUM No results found for: CBMZ No results found for: VALPROATE  Current Medications: Current Outpatient Medications  Medication Sig Dispense Refill  . albuterol (PROVENTIL HFA;VENTOLIN HFA) 108 (90 Base) MCG/ACT inhaler Inhale 2 puffs into the lungs every 6 (six) hours as needed for wheezing or shortness of breath.    . amphetamine-dextroamphetamine (ADDERALL) 30 MG tablet Take 15 mg by mouth 2 (two) times daily.   0  . buprenorphine-naloxone (SUBOXONE) 8-2 mg SUBL SL tablet Place 0.25 tablets under the tongue daily.     Current Facility-Administered Medications  Medication Dose Route Frequency Provider Last Rate Last Admin  . ARIPiprazole ER (ABILIFY MAINTENA) 400 MG prefilled syringe 400 mg  400 mg Intramuscular Q28 days [place] Crane, MD          Psychiatric Specialty Exam: Review of Systems  There were no vitals taken for this visit.There is no height or weight on file to calculate BMI.  General Appearance: Disheveled  Eye Contact:  Good  Speech:  Clear and Coherent and Normal Rate  Volume:  Normal  Mood:  Euthymic  Affect:  Congruent  Thought Process:  Goal Directed and Descriptions of Associations: Intact   Orientation:  Full (Time, Place, and Person)  Thought Content:  Logical, denies any hallucinations or delusions  Suicidal Thoughts:  No  Homicidal Thoughts:  No  Memory:  Immediate;   Good Recent;   Good  Judgement:  Fair  Insight:  Fair  Psychomotor Activity:  Normal  Concentration:  Concentration: Good and Attention Span: Good  Recall:  Good  Fund of Knowledge:Good  Language: Good  Akathisia:  Negative  Handed:  Right  AIMS (if indicated): scored 0  Assets:  Communication Skills Desire for Improvement Financial Resources/Insurance Housing Social Support  ADL's:  Intact  Cognition: WNL  Sleep:  Good      Assessment and Plan: 39 year old male with history of congenital disorder bipolar type, ADHD, polysubstance abuse in remission now seen for continued care.  Patient appears to be  stable on his current regimen of IM Abilify Maintena 200 mg every month and oral Adderall 30 mg twice daily.  He is prescribed his Adderall by Dr. Andrey Campanile.  He just wants to continue here for his monthly IM Abilify Maintena injections.  1. Schizoaffective disorder, bipolar type (HCC) -IM Abilify Maintena 200 mg, received today.  2. Attention deficit hyperactivity disorder (ADHD), predominantly inattentive type - Continue Adderall 30 mg daily prescribed by Dr. Constance Goltz.  3. Polysubstance abuse (HCC) in the past,in patient  Follow-up in a month.   Zena Amos, MD 6/3/20218:39 AM

## 2019-07-10 NOTE — Progress Notes (Signed)
In first to see Dr Evelene Croon for his first time appt and then scheduled for his injection. His Abilify INJ came from [place] and was picked up by a staff member to be here when patient arrived. He receives 200 mg of his 400 mg vial, getting 1 ml. Tolerated injection well in L gluteal muscle. He offered no complaints. Father brought him to his appt.

## 2019-08-08 ENCOUNTER — Encounter (HOSPITAL_COMMUNITY): Payer: Self-pay | Admitting: Psychiatry

## 2019-08-08 ENCOUNTER — Ambulatory Visit (HOSPITAL_COMMUNITY): Payer: Self-pay

## 2019-08-27 ENCOUNTER — Telehealth (HOSPITAL_COMMUNITY): Payer: Self-pay | Admitting: *Deleted

## 2019-08-27 NOTE — Telephone Encounter (Signed)
Dale Calderon is over two weeks late for his injection. Called him at the number provided in his chart but it is a restaurants number. Sent him a letter asking him to reschedule and our number or for him just to come in for his injection.

## 2019-09-05 ENCOUNTER — Other Ambulatory Visit: Payer: Self-pay

## 2019-09-05 ENCOUNTER — Encounter (HOSPITAL_COMMUNITY): Payer: Self-pay

## 2019-09-05 ENCOUNTER — Ambulatory Visit (HOSPITAL_COMMUNITY): Payer: No Payment, Other | Admitting: *Deleted

## 2019-09-05 VITALS — BP 124/86 | HR 79 | Temp 98.7°F | Ht 71.0 in | Wt 222.0 lb

## 2019-09-05 DIAGNOSIS — F250 Schizoaffective disorder, bipolar type: Secondary | ICD-10-CM

## 2019-09-05 NOTE — Progress Notes (Signed)
In nearly one month late for his injection. He responded to Biomedical engineer sent asking him to come in for his injection. Because he is late consulted with Dr Lucianne Muss to confirm he could go ahead with the Abilify 400 mg as ordered. She said it was OK to give it today. Gave him the injectioin in his L gluteal muscle. He tolerated it well. He denies any problems, states everything is going well. States he is spending his summer with his family. 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 He denies any psychotic sx. To return for next injection in one month.

## 2019-10-06 ENCOUNTER — Ambulatory Visit (HOSPITAL_COMMUNITY): Payer: No Payment, Other

## 2019-10-09 ENCOUNTER — Ambulatory Visit (INDEPENDENT_AMBULATORY_CARE_PROVIDER_SITE_OTHER): Payer: No Payment, Other | Admitting: *Deleted

## 2019-10-09 ENCOUNTER — Other Ambulatory Visit: Payer: Self-pay

## 2019-10-09 ENCOUNTER — Encounter (HOSPITAL_COMMUNITY): Payer: Self-pay

## 2019-10-09 VITALS — BP 119/86 | HR 72 | Temp 98.1°F | Wt 227.0 lb

## 2019-10-09 DIAGNOSIS — F250 Schizoaffective disorder, bipolar type: Secondary | ICD-10-CM

## 2019-10-09 NOTE — Progress Notes (Signed)
In a week late for his monthly injection. He denies any problems, no sx of psychosis. 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 He is appropriate but does not initiate conversation. Abilify Maintena 400 mg given in L gluteal muscle without difficulty. He is to return in one month for next injection.

## 2019-11-02 ENCOUNTER — Encounter (HOSPITAL_BASED_OUTPATIENT_CLINIC_OR_DEPARTMENT_OTHER): Payer: Self-pay | Admitting: Emergency Medicine

## 2019-11-02 ENCOUNTER — Emergency Department (HOSPITAL_BASED_OUTPATIENT_CLINIC_OR_DEPARTMENT_OTHER)
Admission: EM | Admit: 2019-11-02 | Discharge: 2019-11-03 | Disposition: A | Discharge status: Home / Self Care | Payer: Self-pay | Attending: Emergency Medicine | Admitting: Emergency Medicine

## 2019-11-02 ENCOUNTER — Other Ambulatory Visit: Payer: Self-pay

## 2019-11-02 ENCOUNTER — Emergency Department (HOSPITAL_BASED_OUTPATIENT_CLINIC_OR_DEPARTMENT_OTHER): Payer: Self-pay

## 2019-11-02 DIAGNOSIS — N50819 Testicular pain, unspecified: Secondary | ICD-10-CM

## 2019-11-02 DIAGNOSIS — N200 Calculus of kidney: Secondary | ICD-10-CM

## 2019-11-02 MED ORDER — ONDANSETRON 4 MG PO TBDP
4.0000 mg | ORAL_TABLET | Freq: Once | ORAL | Status: AC
  Administered 2019-11-02: 4 mg via ORAL
  Filled 2019-11-02: qty 1

## 2019-11-02 MED ORDER — OXYCODONE-ACETAMINOPHEN 5-325 MG PO TABS
1.0000 | ORAL_TABLET | ORAL | Status: DC | PRN
  Administered 2019-11-02: 1 via ORAL
  Filled 2019-11-02: qty 1

## 2019-11-02 NOTE — ED Notes (Signed)
Witnessed EDP Horton perform exam

## 2019-11-02 NOTE — ED Triage Notes (Signed)
Reports left lower abdominal pain that radiates into the left testicle that started earlier today.  Also c/o burning with urination as well as urgency.  Did have blood in the urine yesterday but had no other symptoms at that time.

## 2019-11-03 ENCOUNTER — Emergency Department (HOSPITAL_BASED_OUTPATIENT_CLINIC_OR_DEPARTMENT_OTHER): Payer: Self-pay

## 2019-11-03 LAB — URINALYSIS, ROUTINE W REFLEX MICROSCOPIC
Bilirubin Urine: NEGATIVE
Glucose, UA: NEGATIVE mg/dL
Ketones, ur: NEGATIVE mg/dL
Leukocytes,Ua: NEGATIVE
Nitrite: NEGATIVE
Protein, ur: 100 mg/dL — AB
Specific Gravity, Urine: 1.025 (ref 1.005–1.030)
pH: 7.5 (ref 5.0–8.0)

## 2019-11-03 LAB — URINALYSIS, MICROSCOPIC (REFLEX)
Bacteria, UA: NONE SEEN
Squamous Epithelial / LPF: NONE SEEN (ref 0–5)

## 2019-11-03 MED ORDER — KETOROLAC TROMETHAMINE 30 MG/ML IJ SOLN
30.0000 mg | Freq: Once | INTRAMUSCULAR | Status: AC
  Administered 2019-11-03: 30 mg via INTRAMUSCULAR
  Filled 2019-11-03: qty 1

## 2019-11-03 NOTE — Discharge Instructions (Signed)
You are seen today for urinary symptoms and testicle pain.  Your symptoms are likely related to small kidney stone.  This will likely pass on its own.  Take ibuprofen as needed for pain.

## 2019-11-03 NOTE — ED Provider Notes (Signed)
[facility] EMERGENCY DEPARTMENT Provider Note   CSN: [id number] Arrival date & time: 11/02/19  2012     History Chief Complaint  Patient presents with  . Testicle Pain  . Urinary Retention    CYPRESS FANFAN is a 39 y.o. male.  HPI     This is a 38 year old male who presents with dysuria and testicle pain.  Patient reports burning with urination and urinary urgency.  He noted hematuria yesterday.  Onset of symptoms was yesterday.  He states he has had pain that has radiated into his left testicle.  Currently he is not having any significant abdominal pain.  He is not had any fevers or back pain.  No known history of kidney stones.  Denies any concerns for STDs.  Denies penile discharge.  Past Medical History:  Diagnosis Date  . ADHD (attention deficit hyperactivity disorder)     Patient Active Problem List   Diagnosis Date Noted  . Schizoaffective disorder, bipolar type (HCC) 07/10/2019  . Attention deficit hyperactivity disorder (ADHD), predominantly inattentive type 07/10/2019  . Polysubstance abuse (HCC) in the past 07/10/2019  . Involuntary commitment   . Psychosis (HCC)   . Substance induced mood disorder (HCC) 09/29/2016    History reviewed. No pertinent surgical history.     No family history on file.  Social History   Tobacco Use  . Smoking status: Never Smoker  . Smokeless tobacco: Never Used  Substance Use Topics  . Alcohol use: Yes    Comment: occ  . Drug use: Yes    Types: Marijuana    Comment: opiates    Home Medications Prior to Admission medications   Medication Sig Start Date End Date Taking? Authorizing Provider  albuterol (PROVENTIL HFA;VENTOLIN HFA) 108 (90 Base) MCG/ACT inhaler Inhale 2 puffs into the lungs every 6 (six) hours as needed for wheezing or shortness of breath.    [provider]  amphetamine-dextroamphetamine (ADDERALL) 30 MG tablet Take 15 mg by mouth 2 (two) times daily.  07/05/17   [provider]    Allergies    Patient has no known allergies.  Review of Systems   Review of Systems  Constitutional: Negative for fever.  Gastrointestinal: Positive for abdominal pain. Negative for nausea and vomiting.  Genitourinary: Positive for dysuria, hematuria and urgency. Negative for flank pain.  All other systems reviewed and are negative.   Physical Exam Updated Vital Signs BP 118/68   Pulse 72   Temp 97.8 F (36.6 C) (Oral)   Resp 14   Ht 1.854 m (6\' 1" )   Wt 99.8 kg   SpO2 93%   BMI 29.03 kg/m   Physical Exam Vitals and nursing note reviewed.  Constitutional:      Appearance: He is well-developed. He is not ill-appearing.  HENT:     Head: Normocephalic and atraumatic.     Mouth/Throat:     Mouth: Mucous membranes are moist.  Eyes:     Pupils: Pupils are equal, round, and reactive to light.  Cardiovascular:     Rate and Rhythm: Normal rate and regular rhythm.     Heart sounds: Normal heart sounds. No murmur heard.   Pulmonary:     Effort: Pulmonary effort is normal. No respiratory distress.     Breath sounds: Normal breath sounds. No wheezing.  Abdominal:     General: Bowel sounds are normal.     Palpations: Abdomen is soft.     Tenderness: There is no abdominal tenderness.  There is no right CVA tenderness, left CVA tenderness or rebound.  Genitourinary:    Comments: Circumcised penis, normal testicular lie, no masses noted, cremasteric reflex intact Musculoskeletal:     Cervical back: Neck supple.  Lymphadenopathy:     Cervical: No cervical adenopathy.  Skin:    General: Skin is warm and dry.  Neurological:     Mental Status: He is alert and oriented to person, place, and time.  Psychiatric:        Mood and Affect: Mood normal.     ED Results / Procedures / Treatments   Labs (all labs ordered are listed, but only abnormal results are displayed) Labs Reviewed  URINALYSIS, ROUTINE W REFLEX MICROSCOPIC - Abnormal; Notable for the following components:       Result Value   Hgb urine dipstick TRACE (*)    Protein, ur 100 (*)    All other components within normal limits  URINALYSIS, MICROSCOPIC (REFLEX)  GC/CHLAMYDIA PROBE AMP ([place]) NOT AT [facility]    EKG None  Radiology CT Renal Stone Study  Result Date: 11/03/2019 CLINICAL DATA:  Initial evaluation for acute flank pain, stone disease suspected. EXAM: CT ABDOMEN AND PELVIS WITHOUT CONTRAST TECHNIQUE: Multidetector CT imaging of the abdomen and pelvis was performed following the standard protocol without IV contrast. COMPARISON:  Prior CT from 10/03/2014. FINDINGS: Lower chest: Visualized lung bases are clear. Hepatobiliary: Mild diffuse hypoattenuation liver suggestive of steatosis. Liver otherwise unremarkable. Gallbladder within normal limits. No biliary dilatation. Pancreas: Pancreas within normal limits. Spleen: Subtle subcentimeter hypodensity within the spleen noted, too small the characterize, but of doubtful significance. Spleen otherwise unremarkable. Adrenals/Urinary Tract: Adrenal glands are normal. Punctate 2 mm obstructive stone present at the left UVJ with secondary mild left hydroureteronephrosis. Associated perinephric fat stranding. No other radiopaque calculi seen in along the course of the left renal collecting system or within the left kidney. Right kidney within normal limits without nephrolithiasis or hydronephrosis. No obstructive calculi seen along the course of the right renal collecting system. No right-sided hydroureter. Bladder is essentially completely decompressed without definite abnormality. No visible stones within the bladder lumen. Stomach/Bowel: Stomach within normal limits. Duodenal diverticulum noted arising from the second portion of the duodenum without associated inflammation. No evidence for bowel obstruction. Normal appendix. No acute inflammatory changes seen about the bowels. Vascular/Lymphatic: Intra-abdominal aorta of normal caliber. No adenopathy.  Reproductive: Prostate normal. Other: No free air or fluid. Small fat containing paraumbilical hernia noted without associated inflammation. Musculoskeletal: No acute osseous abnormality. No discrete or worrisome osseous lesions. IMPRESSION: 1. 2 mm obstructive stone at the left UVJ with secondary mild left hydroureteronephrosis. 2. No other acute intra-abdominal or pelvic process. 3. Hepatic steatosis. Electronically Signed   By: Rise Mu M.D.   On: 11/03/2019 02:05   US SCROTUM W/DOPPLER  Result Date: 11/02/2019 CLINICAL DATA:  Dysuria, inability to urinate, left lower quadrant pain radiating into the left testicle today EXAM: SCROTAL ULTRASOUND DOPPLER ULTRASOUND OF THE TESTICLES TECHNIQUE: Complete ultrasound examination of the testicles, epididymis, and other scrotal structures was performed. Color and spectral Doppler ultrasound were also utilized to evaluate blood flow to the testicles. COMPARISON:  None. FINDINGS: Right testicle Measurements: 4.7 x 2.3 x 3.2 cm. Normal parenchyma. No testicular mass or microlithiasis. Left testicle Measurements: 4.4 x 2.33.1 cm. Normal parenchyma. No testicular mass or microlithiasis. Right epididymis:  Normal in size and appearance. Left epididymis:  Normal in size and appearance. Hydrocele: Small bilateral hydroceles with low level internal echoes.  No concerning features of pyocele. Varicocele: Prominent vessels in the left inguinal canal but without frank varicocele. Pulsed Doppler interrogation of both testes demonstrates normal low resistance arterial and venous waveforms bilaterally. IMPRESSION: 1. No evidence of testicular mass, torsion or epididymo-orchitis. 2. Small bilateral hydroceles with low level internal echoes, nonspecific but often incidental. No concerning features of pyocele. 3. Prominent vessels in the left inguinal canal without frank varicocele. Electronically Signed   By: Kreg Shropshire M.D.   On: 11/02/2019 21:28     Procedures Procedures (including critical care time)  Medications Ordered in ED Medications  oxyCODONE-acetaminophen (PERCOCET/ROXICET) 5-325 MG per tablet 1 tablet (1 tablet Oral Given 11/02/19 2029)  ketorolac (TORADOL) 30 MG/ML injection 30 mg (has no administration in time range)  ondansetron (ZOFRAN-ODT) disintegrating tablet 4 mg (4 mg Oral Given 11/02/19 2029)    ED Course  I have reviewed the triage vital signs and the nursing notes.  Pertinent labs & imaging results that were available during my care of the patient were reviewed by me and considered in my medical decision making (see chart for details).    MDM Rules/Calculators/A&P                          Patient presents with urinary symptoms.  Overall nontoxic vital signs are reassuring.  Reports some intermittent abdominal pain but currently without pain.  No back pain or fevers.  Given urinary symptoms, considerations include but not limited to, UTI, kidney stone, ulcer disease.  Initial urinalysis obtained.  No obvious urinary tract infection.  Culture was sent.  Testicular exam is reassuring.  Ultrasound reviewed from triage and shows no evidence of torsion.  He is nontender and doubt epididymitis.  CT scan change to evaluate for kidney stone.  CT scan shows a small 2 mm stone at the UVJ on the left side.  This is likely the culprit of the patient's systems.  He has remained relatively asymptomatic while in the emergency department.  Do not feel he needs further work-up at this time.  Patient was given Toradol.  Recommend ibuprofen as needed.  Patient was given expectant management instructions.  After history, exam, and medical workup I feel the patient has been appropriately medically screened and is safe for discharge home. Pertinent diagnoses were discussed with the patient. Patient was given return precautions.  Final Clinical Impression(s) / ED Diagnoses Final diagnoses:  Kidney stone    Rx / DC Orders ED  Discharge Orders    None       Nateisha Moyd, Mayer Masker, MD 11/03/19 [phone]

## 2019-11-03 NOTE — ED Notes (Signed)
Pt to CT

## 2019-11-03 NOTE — ED Notes (Signed)
Pt encouraged to void; pt had episode where urine got on gown instead of urinal

## 2019-11-04 LAB — GC/CHLAMYDIA PROBE AMP (~~LOC~~) NOT AT ARMC
Chlamydia: NEGATIVE
Comment: NEGATIVE
Comment: NORMAL
Neisseria Gonorrhea: NEGATIVE

## 2019-11-10 ENCOUNTER — Encounter (HOSPITAL_COMMUNITY): Payer: Self-pay | Admitting: Psychiatry

## 2019-11-10 ENCOUNTER — Encounter (HOSPITAL_COMMUNITY): Payer: Self-pay

## 2019-11-10 ENCOUNTER — Ambulatory Visit (HOSPITAL_COMMUNITY): Payer: No Payment, Other | Admitting: *Deleted

## 2019-11-10 ENCOUNTER — Ambulatory Visit (INDEPENDENT_AMBULATORY_CARE_PROVIDER_SITE_OTHER): Payer: No Payment, Other | Admitting: Psychiatry

## 2019-11-10 ENCOUNTER — Other Ambulatory Visit: Payer: Self-pay

## 2019-11-10 VITALS — BP 107/68 | HR 72 | Temp 97.7°F | Ht 73.0 in | Wt 225.0 lb

## 2019-11-10 DIAGNOSIS — F250 Schizoaffective disorder, bipolar type: Secondary | ICD-10-CM

## 2019-11-10 DIAGNOSIS — F1910 Other psychoactive substance abuse, uncomplicated: Secondary | ICD-10-CM | POA: Diagnosis not present

## 2019-11-10 DIAGNOSIS — F900 Attention-deficit hyperactivity disorder, predominantly inattentive type: Secondary | ICD-10-CM

## 2019-11-10 MED ORDER — ARIPIPRAZOLE ER 300 MG IM PRSY
300.0000 mg | PREFILLED_SYRINGE | INTRAMUSCULAR | Status: DC
  Administered 2019-11-10: 300 mg via INTRAMUSCULAR

## 2019-11-10 NOTE — Progress Notes (Signed)
BH MD/PA/NP OP Progress Note  11/10/2019 10:38 AM Dale Calderon  MRN:  [id number]  Chief Complaint:  " I am doing well."  HPI: Patient reported that he has been doing well.  He stated that his mood has been stable.  He denied any auditory or visual hallucinations.  He denied any paranoid delusions.  He stated that he is sleeping well.  He denied any specific issues or concerns pertaining to his mood or psychotic symptoms. He has been taking Adderall prescribed by his other provider.  Visit Diagnosis:    ICD-10-CM   1. Schizoaffective disorder, bipolar type (HCC)  F25.0   2. Attention deficit hyperactivity disorder (ADHD), predominantly inattentive type  F90.0   3. Polysubstance abuse (HCC) in the past  F19.10     Past Psychiatric History: Schizoaffective disorder, ADHD, polysubstance abuse in remission  Past Medical History:  Past Medical History:  Diagnosis Date  . ADHD (attention deficit hyperactivity disorder)    No past surgical history on file.  Family Psychiatric History: denied  Family History: No family history on file.  Social History:  Social History   Socioeconomic History  . Marital status: Single    Spouse name: Not on file  . Number of children: Not on file  . Years of education: Not on file  . Highest education level: Not on file  Occupational History  . Not on file  Tobacco Use  . Smoking status: Never Smoker  . Smokeless tobacco: Never Used  Substance and Sexual Activity  . Alcohol use: Yes    Comment: occ  . Drug use: Yes    Types: Marijuana    Comment: opiates  . Sexual activity: Not on file  Other Topics Concern  . Not on file  Social History Narrative   ** Merged History Encounter **       Social Determinants of Health   Financial Resource Strain:   . Difficulty of Paying Living Expenses: Not on file  Food Insecurity:   . Worried About Programme researcher, broadcasting/film/video in the Last Year: Not on file  . Ran Out of Food in the Last Year: Not on file   Transportation Needs:   . Lack of Transportation (Medical): Not on file  . Lack of Transportation (Non-Medical): Not on file  Physical Activity:   . Days of Exercise per Week: Not on file  . Minutes of Exercise per Session: Not on file  Stress:   . Feeling of Stress : Not on file  Social Connections:   . Frequency of Communication with Friends and Family: Not on file  . Frequency of Social Gatherings with Friends and Family: Not on file  . Attends Religious Services: Not on file  . Active Member of Clubs or Organizations: Not on file  . Attends Banker Meetings: Not on file  . Marital Status: Not on file    Allergies: No Known Allergies  Metabolic Disorder Labs: No results found for: HGBA1C, MPG No results found for: PROLACTIN No results found for: CHOL, TRIG, HDL, CHOLHDL, VLDL, LDLCALC No results found for: TSH  Therapeutic Level Labs: No results found for: LITHIUM No results found for: VALPROATE No components found for:  CBMZ  Current Medications: Current Outpatient Medications  Medication Sig Dispense Refill  . albuterol (PROVENTIL HFA;VENTOLIN HFA) 108 (90 Base) MCG/ACT inhaler Inhale 2 puffs into the lungs every 6 (six) hours as needed for wheezing or shortness of breath.    . amphetamine-dextroamphetamine (ADDERALL) 30 MG  tablet Take 15 mg by mouth 2 (two) times daily.   0   Current Facility-Administered Medications  Medication Dose Route Frequency Provider Last Rate Last Admin  . ARIPiprazole ER (ABILIFY MAINTENA) 300 MG prefilled syringe 300 mg  300 mg Intramuscular Q30 days Zena Amos, MD      . ARIPiprazole ER (ABILIFY MAINTENA) 400 MG prefilled syringe 400 mg  400 mg Intramuscular Q28 days Zena Amos, MD   400 mg at 10/09/19 7939     Musculoskeletal: Strength & Muscle Tone: within normal limits Gait & Station: normal Patient leans: N/A  Psychiatric Specialty Exam: Review of Systems  There were no vitals taken for this visit.There is  no height or weight on file to calculate BMI.  General Appearance: Fairly Groomed  Eye Contact:  Good  Speech:  Clear and Coherent and Normal Rate  Volume:  Normal  Mood:  Anxious  Affect:  Congruent  Thought Process:  Goal Directed and Descriptions of Associations: Intact  Orientation:  Full (Time, Place, and Person)  Thought Content: Logical   Suicidal Thoughts:  No  Homicidal Thoughts:  No  Memory:  Immediate;   Good Recent;   Good  Judgement:  Fair  Insight:  Fair  Psychomotor Activity:  Normal  Concentration:  Concentration: Good and Attention Span: Good  Recall:  Good  Fund of Knowledge: Good  Language: Good  Akathisia:  Negative  Handed:  Right  AIMS (if indicated): not done  Assets:  Communication Skills Desire for Improvement Financial Resources/Insurance Housing  ADL's:  Intact  Cognition: WNL  Sleep:  Good     Assessment and Plan: Patient is stable and is continuing to receive his monthly IM Abilify Maintena injections.  Receives 200 mg dose.  He is also taking oral Adderall 30 mg twice a day prescribed by Dr. Andrey Campanile.  1. Schizoaffective disorder, bipolar type (HCC) -IM Abilify Maintena 200 mg, received today.  2. Attention deficit hyperactivity disorder (ADHD), predominantly inattentive type - Continue Adderall 30 mg daily prescribed by Dr. Constance Goltz.  3. Polysubstance abuse (HCC) in the past,in patient  Continue same medication regimen. Follow up in 3 months.    Zena Amos, MD 11/10/2019, 10:38 AM

## 2019-11-10 NOTE — Progress Notes (Signed)
In as scheduled for his monthly Abilify injection. He reports feeling fine and offers no complaints. He denies any sx of psychosis. 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 He was seen first today by Dr Evelene Croon for med check. Injection given in L gluteal muscle. To return for next injection in one month and in three months to see Dr Evelene Croon.

## 2019-12-11 ENCOUNTER — Encounter (HOSPITAL_COMMUNITY): Payer: Self-pay

## 2019-12-11 ENCOUNTER — Ambulatory Visit (HOSPITAL_COMMUNITY): Payer: No Payment, Other | Admitting: *Deleted

## 2019-12-11 ENCOUNTER — Telehealth (HOSPITAL_COMMUNITY): Payer: Self-pay | Admitting: Psychiatry

## 2019-12-11 ENCOUNTER — Telehealth (HOSPITAL_COMMUNITY): Payer: Self-pay | Admitting: *Deleted

## 2019-12-11 ENCOUNTER — Other Ambulatory Visit: Payer: Self-pay

## 2019-12-11 VITALS — BP 124/87 | HR 90 | Ht 73.0 in | Wt 233.0 lb

## 2019-12-11 DIAGNOSIS — F250 Schizoaffective disorder, bipolar type: Secondary | ICD-10-CM

## 2019-12-11 MED ORDER — ARIPIPRAZOLE ER 400 MG IM PRSY
400.0000 mg | PREFILLED_SYRINGE | INTRAMUSCULAR | Status: AC
Start: 2019-12-11 — End: 2020-11-05
  Administered 2019-12-11 – 2020-10-21 (×10): 400 mg via INTRAMUSCULAR

## 2019-12-11 NOTE — Progress Notes (Addendum)
In as scheduled for his monthly injection. He states he is pursuing disability, this is his first time applying. States he is doing OK since getting started on his shot but he continues to have difficulty working. He has been working for his [employer] but business is slow and he has difficulty even there. He does have food stamps but otherwise his family supports him. Injection of Abilify given to him in  His R gluteal muscle. To return in one month.

## 2019-12-11 NOTE — Telephone Encounter (Signed)
Please clarify for me his dose and put an order it.

## 2019-12-11 NOTE — Telephone Encounter (Signed)
Ordering IM Abilify Maintenna 200 mg injection dose.

## 2020-01-09 ENCOUNTER — Encounter (HOSPITAL_COMMUNITY): Payer: Self-pay

## 2020-01-09 ENCOUNTER — Ambulatory Visit (HOSPITAL_COMMUNITY): Payer: No Payment, Other | Admitting: *Deleted

## 2020-01-09 ENCOUNTER — Other Ambulatory Visit: Payer: Self-pay

## 2020-01-09 VITALS — BP 158/100 | HR 98

## 2020-01-09 DIAGNOSIS — F250 Schizoaffective disorder, bipolar type: Secondary | ICD-10-CM

## 2020-01-09 NOTE — Progress Notes (Signed)
In as scheduled for his monthly Abilify Maintenna injection. Today he received 400 mg in his R gluteal muscle. 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 He denies any sx of his illness and offers no complaints. He is pleasant and appropriate and displays some humor today. To return in one month for his next injection as well as his appt with Dr Evelene Croon for med assessment.

## 2020-02-09 ENCOUNTER — Ambulatory Visit (HOSPITAL_COMMUNITY): Payer: No Payment, Other

## 2020-02-10 ENCOUNTER — Ambulatory Visit (HOSPITAL_COMMUNITY): Payer: No Payment, Other

## 2020-02-10 ENCOUNTER — Encounter (HOSPITAL_COMMUNITY): Payer: No Payment, Other | Admitting: Psychiatry

## 2020-02-11 ENCOUNTER — Ambulatory Visit (HOSPITAL_COMMUNITY): Payer: No Payment, Other

## 2020-02-16 ENCOUNTER — Ambulatory Visit (HOSPITAL_COMMUNITY): Payer: No Payment, Other

## 2020-02-18 ENCOUNTER — Ambulatory Visit (HOSPITAL_COMMUNITY): Payer: No Payment, Other | Admitting: *Deleted

## 2020-02-18 ENCOUNTER — Ambulatory Visit (INDEPENDENT_AMBULATORY_CARE_PROVIDER_SITE_OTHER): Payer: No Payment, Other | Admitting: Psychiatry

## 2020-02-18 ENCOUNTER — Other Ambulatory Visit: Payer: Self-pay

## 2020-02-18 ENCOUNTER — Encounter (HOSPITAL_COMMUNITY): Payer: Self-pay | Admitting: Psychiatry

## 2020-02-18 ENCOUNTER — Encounter (HOSPITAL_COMMUNITY): Payer: Self-pay

## 2020-02-18 VITALS — BP 148/90 | HR 98 | Ht 73.0 in | Wt 238.0 lb

## 2020-02-18 DIAGNOSIS — F250 Schizoaffective disorder, bipolar type: Secondary | ICD-10-CM | POA: Diagnosis not present

## 2020-02-18 DIAGNOSIS — F900 Attention-deficit hyperactivity disorder, predominantly inattentive type: Secondary | ICD-10-CM | POA: Diagnosis not present

## 2020-02-18 DIAGNOSIS — F1910 Other psychoactive substance abuse, uncomplicated: Secondary | ICD-10-CM

## 2020-02-18 NOTE — Progress Notes (Signed)
BH MD/PA/NP OP Progress Note  02/18/2020 9:13 AM Dale Calderon  MRN:  [id number]  Chief Complaint:  " I am okay, my mom was diagnosed with cancer."  HPI: Patient reported that he is doing well.  He informed that he had a stressful holiday season because his mother was diagnosed with cancer few weeks ago.  He informed that his mother has stage IV liver cancer and her provider has given her a few weeks only.  He stated that they are trying to get hospice services set up for her which is a bit stressful for the family.  He stated that he is trying to keep his mother happy and be there for her.  He denies any other significant issues or concerns at this time.  He denies any increased auditory or visual vaccinations.  He denied any increase in his paranoid delusions.   Visit Diagnosis:    ICD-10-CM   1. Schizoaffective disorder, bipolar type (HCC)  F25.0   2. Attention deficit hyperactivity disorder (ADHD), predominantly inattentive type  F90.0   3. Polysubstance abuse (HCC) in the past  F19.10     Past Psychiatric History: Schizoaffective disorder, ADHD, polysubstance abuse in remission  Past Medical History:  Past Medical History:  Diagnosis Date  . ADHD (attention deficit hyperactivity disorder)    No past surgical history on file.  Family Psychiatric History: denied  Family History: No family history on file.  Social History:  Social History   Socioeconomic History  . Marital status: Single    Spouse name: Not on file  . Number of children: Not on file  . Years of education: Not on file  . Highest education level: Not on file  Occupational History  . Not on file  Tobacco Use  . Smoking status: Never Smoker  . Smokeless tobacco: Never Used  Substance and Sexual Activity  . Alcohol use: Yes    Comment: occ  . Drug use: Yes    Types: Marijuana    Comment: opiates  . Sexual activity: Not on file  Other Topics Concern  . Not on file  Social History Narrative   ** Merged  History Encounter **       Social Determinants of Health   Financial Resource Strain: Not on file  Food Insecurity: Not on file  Transportation Needs: Not on file  Physical Activity: Not on file  Stress: Not on file  Social Connections: Not on file    Allergies: No Known Allergies  Metabolic Disorder Labs: No results found for: HGBA1C, MPG No results found for: PROLACTIN No results found for: CHOL, TRIG, HDL, CHOLHDL, VLDL, LDLCALC No results found for: TSH  Therapeutic Level Labs: No results found for: LITHIUM No results found for: VALPROATE No components found for:  CBMZ  Current Medications: Current Outpatient Medications  Medication Sig Dispense Refill  . albuterol (PROVENTIL HFA;VENTOLIN HFA) 108 (90 Base) MCG/ACT inhaler Inhale 2 puffs into the lungs every 6 (six) hours as needed for wheezing or shortness of breath.    . amphetamine-dextroamphetamine (ADDERALL) 30 MG tablet Take 15 mg by mouth 2 (two) times daily.   0   Current Facility-Administered Medications  Medication Dose Route Frequency Provider Last Rate Last Admin  . ARIPiprazole ER (ABILIFY MAINTENA) 400 MG prefilled syringe 400 mg  400 mg Intramuscular Q30 days Zena Amos, MD   400 mg at 02/18/20 0900     Musculoskeletal: Strength & Muscle Tone: within normal limits Gait & Station: normal Patient leans:  N/A  Psychiatric Specialty Exam: Review of Systems  There were no vitals taken for this visit.There is no height or weight on file to calculate BMI.  General Appearance: Fairly Groomed  Eye Contact:  Good  Speech:  Clear and Coherent and Normal Rate  Volume:  Normal  Mood:  Euthymic  Affect:  Congruent  Thought Process:  Goal Directed and Descriptions of Associations: Intact  Orientation:  Full (Time, Place, and Person)  Thought Content: Logical   Suicidal Thoughts:  No  Homicidal Thoughts:  No  Memory:  Immediate;   Good Recent;   Good  Judgement:  Fair  Insight:  Fair  Psychomotor  Activity:  Normal  Concentration:  Concentration: Good and Attention Span: Good  Recall:  Good  Fund of Knowledge: Good  Language: Good  Akathisia:  Negative  Handed:  Right  AIMS (if indicated): not done  Assets:  Communication Skills Desire for Improvement Financial Resources/Insurance Housing  ADL's:  Intact  Cognition: WNL  Sleep:  Good     Assessment and Plan: Patient appears to be stable, he is worried about his mother starting hospice in the near future.  1. Schizoaffective disorder, bipolar type (HCC) -IM Abilify Maintena 200 mg, received today.  2. Attention deficit hyperactivity disorder (ADHD), predominantly inattentive type - Continue Adderall 30 mg daily prescribed by Dr. Constance Goltz.  3. Polysubstance abuse (HCC) in the past,in patient  Continue same medication regimen. Follow up in 3 months.    Zena Amos, MD 02/18/2020, 9:13 AM

## 2020-02-18 NOTE — Progress Notes (Signed)
In a week late for his monthly injection. He takes Abilify M 400 mg today got it in his L gluteal muscle. He reports having a small cold and frequently sniffs. He was asked what was new in his life, good or bad and stated his mom has stage 4 cancer of her abdomen and is not expected to live more than a few months. He has sisters that are care taking for her now. He states he doesn't know why he missed his shot last week, his dad was who called to reschedule him. He offers no complaints and denies any issues with his illness or being late for his appt. He is pleasant and appropriate. Also seen by Dr Evelene Croon today for a med check. He is to return in one month for his next injection and in three months to follow up with provider.

## 2020-03-18 ENCOUNTER — Ambulatory Visit (INDEPENDENT_AMBULATORY_CARE_PROVIDER_SITE_OTHER): Payer: No Payment, Other

## 2020-03-18 ENCOUNTER — Encounter (HOSPITAL_COMMUNITY): Payer: Self-pay

## 2020-03-18 ENCOUNTER — Other Ambulatory Visit: Payer: Self-pay

## 2020-03-18 DIAGNOSIS — F250 Schizoaffective disorder, bipolar type: Secondary | ICD-10-CM

## 2020-03-18 MED ORDER — ARIPIPRAZOLE ER 400 MG IM SRER
400.0000 mg | Freq: Once | INTRAMUSCULAR | Status: AC
  Administered 2020-03-18: 400 mg via INTRAMUSCULAR

## 2020-03-18 NOTE — Patient Instructions (Signed)
Patient presented today for his Abilify Maintena 400mg  injection. Patient presented with flat affect and cooperative nature. Injection given in Right Gluteal Quadrant. Pt denied any SI or HI or VH/AH. He stated that he's been doing well on his medications. Patient to return in 1 month for next injection

## 2020-04-15 ENCOUNTER — Other Ambulatory Visit: Payer: Self-pay

## 2020-04-15 ENCOUNTER — Encounter (HOSPITAL_COMMUNITY): Payer: Self-pay

## 2020-04-15 ENCOUNTER — Ambulatory Visit (HOSPITAL_COMMUNITY): Payer: No Payment, Other | Admitting: *Deleted

## 2020-04-15 VITALS — BP 122/88 | HR 95 | Temp 97.8°F | Ht 73.0 in | Wt 227.0 lb

## 2020-04-15 DIAGNOSIS — F250 Schizoaffective disorder, bipolar type: Secondary | ICD-10-CM

## 2020-04-15 NOTE — Progress Notes (Signed)
In as scheduled for his monthly injection of Abilify M 400 mg. Today he got his shot in his R gluteal muscle without difficulty. Asked about his mom who was dying of a cancer, he states when asked today she passed away on 2022-04-19, he said she died at home and peacefully. He remains flat, offers little but appropriate when asked questions with his answers. He has an odor of cigarette smoke but looks clean and today is nicely dressed. He says he continues to work at his [employer] a few days a week and hangs out with his nieces and nephews. No complaints offered. To return in one month for his injection and two days earlier to see the provider for a med check.

## 2020-05-11 ENCOUNTER — Ambulatory Visit (HOSPITAL_COMMUNITY): Payer: No Payment, Other | Admitting: Psychiatry

## 2020-05-13 ENCOUNTER — Encounter (HOSPITAL_COMMUNITY): Payer: Self-pay

## 2020-05-13 ENCOUNTER — Other Ambulatory Visit: Payer: Self-pay

## 2020-05-13 ENCOUNTER — Ambulatory Visit (HOSPITAL_COMMUNITY): Payer: No Payment, Other | Admitting: *Deleted

## 2020-05-13 VITALS — BP 130/88 | HR 93 | Ht 73.0 in | Wt 228.0 lb

## 2020-05-13 DIAGNOSIS — F250 Schizoaffective disorder, bipolar type: Secondary | ICD-10-CM

## 2020-05-13 NOTE — Progress Notes (Signed)
In an hour late for his appt today, running late. He missed his appt with the provider this week. Reschedule with Dr Evelene Croon a week before his next injection is due. He celebrated his 40th birthday about one week ago, he spent it with his family and got money for his gift. He is in good spirits, a bit more spontaneous with writer than usual, offered more conversation and expressed some humor. Today he got his Abilify M 400 mg in his L gluteal muscle without difficulty. To return in one month for his next injection.

## 2020-05-18 ENCOUNTER — Ambulatory Visit (HOSPITAL_COMMUNITY): Payer: No Payment, Other | Admitting: Psychiatry

## 2020-06-01 ENCOUNTER — Ambulatory Visit (INDEPENDENT_AMBULATORY_CARE_PROVIDER_SITE_OTHER): Payer: No Payment, Other | Admitting: Psychiatry

## 2020-06-01 ENCOUNTER — Encounter (HOSPITAL_COMMUNITY): Payer: Self-pay | Admitting: Psychiatry

## 2020-06-01 ENCOUNTER — Other Ambulatory Visit: Payer: Self-pay

## 2020-06-01 VITALS — BP 121/86 | HR 86 | Ht 72.0 in | Wt 225.0 lb

## 2020-06-01 DIAGNOSIS — F1910 Other psychoactive substance abuse, uncomplicated: Secondary | ICD-10-CM | POA: Diagnosis not present

## 2020-06-01 DIAGNOSIS — F900 Attention-deficit hyperactivity disorder, predominantly inattentive type: Secondary | ICD-10-CM

## 2020-06-01 DIAGNOSIS — F250 Schizoaffective disorder, bipolar type: Secondary | ICD-10-CM

## 2020-06-01 NOTE — Progress Notes (Signed)
BH MD/PA/NP OP Progress Note  06/01/2020 9:59 AM Dale Calderon  MRN:  [id number]  Chief Complaint:  " I am doing fine."  HPI: Patient seen for follow-up today.  He informed that he is doing well.  He stated that his mood is stable.  He denied any hallucinations or delusions.  He stated he is sleeping well at night. Writer asked him about his family's abdomen is doing well. When the writer specifically asked about his mother who was diagnosed with cancer, he informed that she passed away on 02-07-2024a few days after he saw the Clinical research associate in January. Writer expressed her condolences and patient stated that he is managing well for now.  Visit Diagnosis:    ICD-10-CM   1. Schizoaffective disorder, bipolar type (HCC)  F25.0   2. Attention deficit hyperactivity disorder (ADHD), predominantly inattentive type  F90.0   3. Polysubstance abuse (HCC) in the past  F19.10     Past Psychiatric History: Schizoaffective disorder, ADHD, polysubstance abuse in remission  Past Medical History:  Past Medical History:  Diagnosis Date  . ADHD (attention deficit hyperactivity disorder)    No past surgical history on file.  Family Psychiatric History: denied  Family History: No family history on file.  Social History:  Social History   Socioeconomic History  . Marital status: Single    Spouse name: Not on file  . Number of children: Not on file  . Years of education: Not on file  . Highest education level: Not on file  Occupational History  . Not on file  Tobacco Use  . Smoking status: Never Smoker  . Smokeless tobacco: Never Used  Substance and Sexual Activity  . Alcohol use: Yes    Comment: occ  . Drug use: Yes    Types: Marijuana    Comment: opiates  . Sexual activity: Not on file  Other Topics Concern  . Not on file  Social History Narrative   ** Merged History Encounter **       Social Determinants of Health   Financial Resource Strain: Not on file  Food Insecurity: Not on file   Transportation Needs: Not on file  Physical Activity: Not on file  Stress: Not on file  Social Connections: Not on file    Allergies: No Known Allergies  Metabolic Disorder Labs: No results found for: HGBA1C, MPG No results found for: PROLACTIN No results found for: CHOL, TRIG, HDL, CHOLHDL, VLDL, LDLCALC No results found for: TSH  Therapeutic Level Labs: No results found for: LITHIUM No results found for: VALPROATE No components found for:  CBMZ  Current Medications: Current Outpatient Medications  Medication Sig Dispense Refill  . albuterol (PROVENTIL HFA;VENTOLIN HFA) 108 (90 Base) MCG/ACT inhaler Inhale 2 puffs into the lungs every 6 (six) hours as needed for wheezing or shortness of breath.    . amphetamine-dextroamphetamine (ADDERALL) 30 MG tablet Take 15 mg by mouth 2 (two) times daily.   0   Current Facility-Administered Medications  Medication Dose Route Frequency Provider Last Rate Last Admin  . ARIPiprazole ER (ABILIFY MAINTENA) 400 MG prefilled syringe 400 mg  400 mg Intramuscular Q30 days Zena Amos, MD   400 mg at 05/13/20 1538     Musculoskeletal: Strength & Muscle Tone: within normal limits Gait & Station: normal Patient leans: N/A  Psychiatric Specialty Exam: Review of Systems  Blood pressure 121/86, pulse 86, height 6' (1.829 m), weight 225 lb (102.1 kg), SpO2 98 %.Body mass index is 30.52 kg/m.  General Appearance: Fairly Groomed  Eye Contact:  Good  Speech:  Clear and Coherent and Normal Rate  Volume:  Normal  Mood:  Euthymic  Affect:  Constricted  Thought Process:  Goal Directed and Descriptions of Associations: Intact  Orientation:  Full (Time, Place, and Person)  Thought Content: Logical   Suicidal Thoughts:  No  Homicidal Thoughts:  No  Memory:  Immediate;   Good Recent;   Good  Judgement:  Fair  Insight:  Fair  Psychomotor Activity:  Normal  Concentration:  Concentration: Good and Attention Span: Good  Recall:  Good  Fund of  Knowledge: Good  Language: Good  Akathisia:  Negative  Handed:  Right  AIMS (if indicated): 1  Assets:  Communication Skills Desire for Improvement Financial Resources/Insurance Housing  ADL's:  Intact  Cognition: WNL  Sleep:  Good     Assessment and Plan: Pt seems to be doing fairly well on his current regimen.  1. Schizoaffective disorder, bipolar type (HCC) -IM Abilify Maintena 200 mg, last dose received on April 7, next dose is scheduled on May 5.  2. Attention deficit hyperactivity disorder (ADHD), predominantly inattentive type - Continue Adderall 30 mg daily prescribed by Dr. Constance Goltz.  3. Polysubstance abuse (HCC) in the past,in patient  Continue same medication regimen. Follow up in 3 months.    Zena Amos, MD 06/01/2020, 9:59 AM

## 2020-06-10 ENCOUNTER — Ambulatory Visit (HOSPITAL_COMMUNITY): Payer: No Payment, Other

## 2020-06-23 ENCOUNTER — Ambulatory Visit (HOSPITAL_COMMUNITY): Payer: No Payment, Other | Admitting: *Deleted

## 2020-06-23 ENCOUNTER — Encounter (HOSPITAL_COMMUNITY): Payer: Self-pay

## 2020-06-23 ENCOUNTER — Other Ambulatory Visit: Payer: Self-pay

## 2020-06-23 VITALS — BP 113/84 | HR 70 | Ht 72.0 in | Wt 227.0 lb

## 2020-06-23 DIAGNOSIS — F250 Schizoaffective disorder, bipolar type: Secondary | ICD-10-CM

## 2020-06-23 NOTE — Progress Notes (Signed)
In two weeks late for his injection. Writer called him twice to remind him of his shot. He has a good appearance today, beard groomed, and recent hair cut and clothes are clean, sometimes this is not his presentation., He laughed some with Clinical research associate and was more verbal than at most visits. He denies any sx of his illness. He got Abilify M 400 mg today in his R gluteal muscle. He is to return in one month for his next injection.

## 2020-07-21 ENCOUNTER — Ambulatory Visit (HOSPITAL_COMMUNITY): Payer: No Payment, Other

## 2020-07-28 ENCOUNTER — Encounter (HOSPITAL_COMMUNITY): Payer: Self-pay

## 2020-07-28 ENCOUNTER — Ambulatory Visit (HOSPITAL_COMMUNITY): Payer: No Payment, Other | Admitting: *Deleted

## 2020-07-28 ENCOUNTER — Other Ambulatory Visit: Payer: Self-pay

## 2020-07-28 VITALS — BP 122/85 | HR 86 | Ht 73.0 in | Wt 225.0 lb

## 2020-07-28 DIAGNOSIS — F250 Schizoaffective disorder, bipolar type: Secondary | ICD-10-CM

## 2020-07-28 NOTE — Progress Notes (Signed)
In as scheduled for his monthly injection, He is several weeks late, writer called him last week to schedule him in today. He offers no complaints. He denies any psychotic sx or HI or SI. He states she has just been spending time "chilling" He is pleasant and appropriately verbal. Injection of Abilify M 400 mg given in his R gluteal muscle without incident. To return in one month.

## 2020-08-12 ENCOUNTER — Encounter (HOSPITAL_BASED_OUTPATIENT_CLINIC_OR_DEPARTMENT_OTHER): Payer: Self-pay | Admitting: Emergency Medicine

## 2020-08-12 ENCOUNTER — Emergency Department (HOSPITAL_BASED_OUTPATIENT_CLINIC_OR_DEPARTMENT_OTHER)
Admission: EM | Admit: 2020-08-12 | Discharge: 2020-08-12 | Disposition: A | Discharge status: Home / Self Care | Payer: Self-pay | Attending: Emergency Medicine | Admitting: Emergency Medicine

## 2020-08-12 ENCOUNTER — Other Ambulatory Visit: Payer: Self-pay

## 2020-08-12 DIAGNOSIS — J029 Acute pharyngitis, unspecified: Secondary | ICD-10-CM | POA: Insufficient documentation

## 2020-08-12 DIAGNOSIS — K0889 Other specified disorders of teeth and supporting structures: Secondary | ICD-10-CM | POA: Insufficient documentation

## 2020-08-12 LAB — GROUP A STREP BY PCR: Group A Strep by PCR: NOT DETECTED

## 2020-08-12 NOTE — ED Provider Notes (Signed)
[facility] EMERGENCY DEPARTMENT Provider Note   CSN: [id number] Arrival date & time: 08/12/20  0759     History Chief Complaint  Patient presents with   Dental Pain    Dale Calderon is a 40 y.o. male.  Patient with a complaint of a sore throat and discomfort on the roof of his mouth since Tuesday morning.  No other complaints or symptoms.  No tooth pain no congestion no cough no fevers no body aches no abdominal pain no chest pain no shortness of breath.  Patient has not had strep throat in the past.  No known sick exposure.  Temp here 98.2.      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 No pertinent surgical history.     No family history on file.  Social History   Tobacco Use   Smoking status: Never   Smokeless tobacco: Never  Substance Use Topics   Alcohol use: Yes    Comment: occ   Drug use: Yes    Types: Marijuana    Comment: opiates    Home Medications Prior to Admission medications   Medication Sig Start Date End Date Taking? Authorizing Provider  albuterol (PROVENTIL HFA;VENTOLIN HFA) 108 (90 Base) MCG/ACT inhaler Inhale 2 puffs into the lungs every 6 (six) hours as needed for wheezing or shortness of breath.    [provider]  amphetamine-dextroamphetamine (ADDERALL) 30 MG tablet Take 15 mg by mouth 2 (two) times daily.  07/05/17   [provider]    Allergies    Patient has no known allergies.  Review of Systems   Review of Systems  Constitutional:  Negative for chills and fever.  HENT:  Positive for  sore throat. Negative for congestion, dental problem, drooling and ear pain.   Eyes:  Negative for pain and visual disturbance.  Respiratory:  Negative for cough and shortness of breath.   Cardiovascular:  Negative for chest pain and palpitations.  Gastrointestinal:  Negative for abdominal pain and vomiting.  Genitourinary:  Negative for dysuria and hematuria.  Musculoskeletal:  Negative for arthralgias and back pain.  Skin:  Negative for color change and rash.  Neurological:  Negative for seizures and syncope.  All other systems reviewed and are negative.  Physical Exam Updated Vital Signs BP (!) 127/92 (BP Location: Right Arm)   Pulse 64   Temp 98.2 F (36.8 C) (Oral)   Resp 17   Ht 1.854 m (6\' 1" )   Wt 102.1 kg   SpO2 96%   BMI 29.69 kg/m   Physical Exam Vitals and nursing note reviewed.  Constitutional:      General: He is not in acute distress.    Appearance: Normal appearance. He is well-developed. He is not ill-appearing.  HENT:     Head: Normocephalic and atraumatic.     Mouth/Throat:     Mouth: Mucous membranes are moist.     Pharynx: Posterior oropharyngeal erythema present. No oropharyngeal exudate.     Comments: Oropharynx with redness to the soft palate area.  Uvula is midline.  Erythema down to around  the tonsils.  No open lesions.  But some bumps on the soft palate area.  No exudate.  Patient able to swallow fine Eyes:     Extraocular Movements: Extraocular movements intact.     Conjunctiva/sclera: Conjunctivae normal.     Pupils: Pupils are equal, round, and reactive to light.  Cardiovascular:     Rate and Rhythm: Normal rate and regular rhythm.     Heart sounds: No murmur heard. Pulmonary:     Effort: Pulmonary effort is normal. No respiratory distress.     Breath sounds: Normal breath sounds.  Abdominal:     Palpations: Abdomen is soft.     Tenderness: There is no abdominal tenderness.  Musculoskeletal:        General: No swelling.     Cervical  back: Neck supple.  Skin:    General: Skin is warm and dry.  Neurological:     General: No focal deficit present.     Mental Status: He is alert and oriented to person, place, and time.    ED Results / Procedures / Treatments   Labs (all labs ordered are listed, but only abnormal results are displayed) Labs Reviewed  GROUP A STREP BY PCR    EKG None  Radiology No results found.  Procedures Procedures   Medications Ordered in ED Medications - No data to display  ED Course  I have reviewed the triage vital signs and the nursing notes.  Pertinent labs & imaging results that were available during my care of the patient were reviewed by me and considered in my medical decision making (see chart for details).    MDM Rules/Calculators/A&P                          Patient symptoms consistent with a pharyngitis.  Rapid strep is negative.  Will treat with Motrin 800 mg every 8 hours.  Patient will return for any new or worse symptoms.   Final Clinical Impression(s) / ED Diagnoses Final diagnoses:  Pharyngitis, unspecified etiology    Rx / DC Orders ED Discharge Orders     None        Vanetta Mulders, MD 08/12/20 [phone]

## 2020-08-12 NOTE — Discharge Instructions (Addendum)
Take Motrin for the pharyngitis.  Strep test was negative.  Would recommend 800 mg of Motrin every 8 hours.  Return for any new or worse symptoms.

## 2020-08-12 NOTE — ED Triage Notes (Signed)
Pt reports blister on the "roof" of his mouth that started Tuesday morning.

## 2020-08-26 ENCOUNTER — Other Ambulatory Visit: Payer: Self-pay

## 2020-08-26 ENCOUNTER — Ambulatory Visit (HOSPITAL_COMMUNITY): Payer: No Payment, Other | Admitting: *Deleted

## 2020-08-26 ENCOUNTER — Encounter (HOSPITAL_COMMUNITY): Payer: Self-pay

## 2020-08-26 VITALS — BP 113/77 | HR 72 | Temp 98.1°F | Ht 73.0 in | Wt 226.4 lb

## 2020-08-26 DIAGNOSIS — F250 Schizoaffective disorder, bipolar type: Secondary | ICD-10-CM

## 2020-08-26 NOTE — Progress Notes (Signed)
In for his monthly injection. Today he shot was given in his L gluteal muscle without difficulty, He gets Abilify M 400 mg. He denies any issues and offers no complaints. States he has been home today cleaning out the pool to get it ready for his soon to be 39 yo nephews birthday. He is in fact wearing his swimsuit. He is to return in one month for his next injection.

## 2020-09-10 ENCOUNTER — Encounter (HOSPITAL_COMMUNITY): Payer: No Payment, Other | Admitting: Physician Assistant

## 2020-09-10 ENCOUNTER — Encounter (HOSPITAL_COMMUNITY): Payer: No Payment, Other | Admitting: Family

## 2020-09-23 ENCOUNTER — Other Ambulatory Visit: Payer: Self-pay

## 2020-09-23 ENCOUNTER — Ambulatory Visit (HOSPITAL_COMMUNITY): Payer: No Payment, Other | Admitting: *Deleted

## 2020-09-23 ENCOUNTER — Encounter (HOSPITAL_COMMUNITY): Payer: Self-pay

## 2020-09-23 ENCOUNTER — Ambulatory Visit (INDEPENDENT_AMBULATORY_CARE_PROVIDER_SITE_OTHER): Payer: No Payment, Other | Admitting: Registered Nurse

## 2020-09-23 VITALS — BP 117/79 | HR 92 | Ht 73.0 in | Wt 228.0 lb

## 2020-09-23 DIAGNOSIS — F1910 Other psychoactive substance abuse, uncomplicated: Secondary | ICD-10-CM

## 2020-09-23 DIAGNOSIS — F250 Schizoaffective disorder, bipolar type: Secondary | ICD-10-CM | POA: Diagnosis not present

## 2020-09-23 DIAGNOSIS — F900 Attention-deficit hyperactivity disorder, predominantly inattentive type: Secondary | ICD-10-CM | POA: Diagnosis not present

## 2020-09-23 NOTE — Progress Notes (Signed)
In as scheduled to see both the provider and get his injection. He got Invega S 156 mg today in his L GLUTEAL muscle without difficulty. No complaints offered. States today he will just be "hanging out" No complaints offered. 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 He has a hair cut and his personal hygiene is improved today. He is to return for his next injection in one month.

## 2020-09-23 NOTE — Progress Notes (Signed)
[facility] MD/PA/NP OP Progress Note  09/23/2020 2:12 PM Dale Calderon  MRN:  [id number]  Chief Complaint:  "I've been good"  HPI: Patient seen for follow up and to receive his long acting injectable (Abilify Maintena).   Patient became mor talkative when started talking about his sister birthday party that was held at his fathers house (pool party).  Patient states that his family is his primary support.  He reports he is unemployed at this time but is working on try to get his disability.  Patient reports that he has been eating and sleeping without difficulty; and tolerating medications without adverse reaction.  Patient state he has been doing well.  Patient denies suicidal/self-harm/homicidal ideation, psychosis, and paranoia.  Patient also denies any delusional thoughts.   Visit Diagnosis:    ICD-10-CM   1. Schizoaffective disorder, bipolar type (HCC)  F25.0     2. Polysubstance abuse (HCC) in the past  F19.10     3. Attention deficit hyperactivity disorder (ADHD), predominantly inattentive type  F90.0       Past Psychiatric History: Schizoaffective disorder, ADHD, polysubstance abuse in remission  Past Medical History:  Past Medical History:  Diagnosis Date   ADHD (attention deficit hyperactivity disorder)    History reviewed. No pertinent surgical history.  Family Psychiatric History: denied  Family History: History reviewed. No pertinent family history.  Social History:  Social History   Socioeconomic History   Marital status: Single    Spouse name: Not on file   Number of children: Not on file   Years of education: Not on file   Highest education level: Not on file  Occupational History   Not on file  Tobacco Use   Smoking status: Never   Smokeless tobacco: Never  Substance and Sexual Activity   Alcohol use: Yes    Comment: occ   Drug use: Yes    Types: Marijuana    Comment: opiates   Sexual activity: Not on file  Other Topics Concern   Not on file  Social  History Narrative   ** Merged History Encounter **       Social Determinants of Health   Financial Resource Strain: Not on file  Food Insecurity: Not on file  Transportation Needs: Not on file  Physical Activity: Not on file  Stress: Not on file  Social Connections: Not on file    Allergies: No Known Allergies  Metabolic Disorder Labs: No results found for: HGBA1C, MPG No results found for: PROLACTIN No results found for: CHOL, TRIG, HDL, CHOLHDL, VLDL, LDLCALC No results found for: TSH  Therapeutic Level Labs: No results found for: LITHIUM No results found for: VALPROATE No components found for:  CBMZ  Current Medications: Current Outpatient Medications  Medication Sig Dispense Refill   albuterol (PROVENTIL HFA;VENTOLIN HFA) 108 (90 Base) MCG/ACT inhaler Inhale 2 puffs into the lungs every 6 (six) hours as needed for wheezing or shortness of breath.     amphetamine-dextroamphetamine (ADDERALL) 30 MG tablet Take 15 mg by mouth 2 (two) times daily.   0   Current Facility-Administered Medications  Medication Dose Route Frequency Provider Last Rate Last Admin   ARIPiprazole ER (ABILIFY MAINTENA) 400 MG prefilled syringe 400 mg  400 mg Intramuscular Q30 days Zena Amos, MD   400 mg at 09/23/20 1401     Musculoskeletal: Strength & Muscle Tone: within normal limits Gait & Station: normal Patient leans: N/A  Psychiatric Specialty Exam: Review of Systems  Constitutional: Negative.   HENT:  Negative.    Eyes: Negative.   Respiratory: Negative.    Cardiovascular: Negative.   Gastrointestinal: Negative.   Genitourinary: Negative.   Musculoskeletal: Negative.   Skin: Negative.   Neurological: Negative.   Hematological: Negative.   Psychiatric/Behavioral:  Negative for agitation, behavioral problems (Denies), confusion, decreased concentration (States medicaton helps with concentration), dysphoric mood, hallucinations (Denies), self-injury (Denies), sleep disturbance  (Denies) and suicidal ideas (Denies). Nervous/anxious: Stable. Hyperactive: Denies.    Blood pressure 117/79, pulse 92, height 6\' 1"  (1.854 m), weight 228 lb (103.4 kg), SpO2 97 %.Body mass index is 30.08 kg/m.  General Appearance: Fairly Groomed and appropriate  Eye Contact:  Good  Speech:  Clear and Coherent and Normal Rate  Volume:  Normal  Mood:  Euthymic  Affect:  Appropriate and Congruent  Thought Process:  Goal Directed and Descriptions of Associations: Intact  Orientation:  Full (Time, Place, and Person)  Thought Content: Logical   Suicidal Thoughts:  No  Homicidal Thoughts:  No  Memory:  Immediate;   Good Recent;   Good  Judgement:  Intact  Insight:  Present  Psychomotor Activity:  Normal  Concentration:  Concentration: Good and Attention Span: Good  Recall:  Good  Fund of Knowledge: Good  Language: Good  Akathisia:  Negative  Handed:  Right  AIMS (if indicated): 1  Assets:  Communication Skills Desire for Improvement Financial Resources/Insurance Housing Social Support Transportation  ADL's:  Intact  Cognition: WNL  Sleep:  Good     Assessment and Plan: Appears to be doing well.  During visit A. Pietrzyk is sitting up right in chair in no acute distress.  ; He is alert/oriented x 4; calm/cooperative; and mood congruent with affect.  Patient is speaking in a clear tone at moderate volume, and normal pace; with good eye contact.  His thought process is coherent and relevant; There is no indication that he is currently responding to internal/external stimuli or experiencing delusional thought content.  Patient denies suicidal/self-harm/homicidal ideation, psychosis, and paranoia.      1. Schizoaffective disorder, bipolar type (HCC) -IM Abilify Maintena 200 mg, last dose received on September 23, 2020, next dose scheduled for  October 21, 2020.   2. Attention deficit hyperactivity disorder (ADHD), predominantly inattentive type - Continue Adderall 30 mg daily  prescribed by Dr. October 23, 2020.   3. Polysubstance abuse (HCC) in the past,in patient  Continue same medication regimen. Follow up in one month   Vaani Morren, NP 09/23/2020, 2:12 PM

## 2020-10-21 ENCOUNTER — Other Ambulatory Visit: Payer: Self-pay

## 2020-10-21 ENCOUNTER — Ambulatory Visit (HOSPITAL_COMMUNITY): Payer: No Payment, Other | Admitting: *Deleted

## 2020-10-21 ENCOUNTER — Encounter (HOSPITAL_COMMUNITY): Payer: Self-pay

## 2020-10-21 VITALS — BP 116/86 | HR 73 | Ht 73.0 in | Wt 225.0 lb

## 2020-10-21 DIAGNOSIS — F250 Schizoaffective disorder, bipolar type: Secondary | ICD-10-CM

## 2020-10-21 NOTE — Progress Notes (Signed)
In as scheduled for his monthly injection of Abilify M 400 mg. He got his shot today in his R GLUTEAL muscle without difficulty. He seems somewhat more animated today than his usual affect. His speech is spontaneous but as is his baseline he does not initiate conversation but will respond back appropriately. He doesn't offer any complaints. His father brought him to his appt but remains outside in the car waiting for him. He is to return in one month for his next appt.

## 2020-11-18 ENCOUNTER — Ambulatory Visit (HOSPITAL_COMMUNITY): Payer: No Payment, Other | Admitting: *Deleted

## 2020-11-18 ENCOUNTER — Encounter (HOSPITAL_COMMUNITY): Payer: Self-pay

## 2020-11-18 ENCOUNTER — Other Ambulatory Visit: Payer: Self-pay

## 2020-11-18 VITALS — BP 109/90 | HR 82 | Ht 73.0 in | Wt 231.0 lb

## 2020-11-18 DIAGNOSIS — F250 Schizoaffective disorder, bipolar type: Secondary | ICD-10-CM

## 2020-11-18 MED ORDER — ARIPIPRAZOLE ER 400 MG IM PRSY
400.0000 mg | PREFILLED_SYRINGE | INTRAMUSCULAR | Status: AC
  Administered 2020-11-18 – 2021-09-08 (×8): 400 mg via INTRAMUSCULAR

## 2020-11-18 NOTE — Progress Notes (Signed)
In as scheduled for his monthly injection of Abilify M 400 mg given today in his L GLUTEAL muscle without issue. He is more animated than he usually is, as he was last month. He smells strongly of cigarettes but his over all appearance is good. He denies any problems. Offered some minimal conversation. He is to return in one month. No complaints offered.

## 2020-12-16 ENCOUNTER — Encounter (HOSPITAL_COMMUNITY): Payer: Self-pay | Admitting: Registered Nurse

## 2020-12-16 ENCOUNTER — Encounter (HOSPITAL_COMMUNITY): Payer: Self-pay

## 2020-12-16 ENCOUNTER — Ambulatory Visit (INDEPENDENT_AMBULATORY_CARE_PROVIDER_SITE_OTHER): Payer: No Payment, Other | Admitting: Registered Nurse

## 2020-12-16 ENCOUNTER — Ambulatory Visit (HOSPITAL_COMMUNITY): Payer: No Payment, Other | Admitting: *Deleted

## 2020-12-16 ENCOUNTER — Other Ambulatory Visit: Payer: Self-pay

## 2020-12-16 VITALS — BP 111/77 | HR 80 | Ht 73.0 in | Wt 227.0 lb

## 2020-12-16 DIAGNOSIS — F250 Schizoaffective disorder, bipolar type: Secondary | ICD-10-CM

## 2020-12-16 DIAGNOSIS — F900 Attention-deficit hyperactivity disorder, predominantly inattentive type: Secondary | ICD-10-CM

## 2020-12-16 DIAGNOSIS — F1910 Other psychoactive substance abuse, uncomplicated: Secondary | ICD-10-CM | POA: Diagnosis not present

## 2020-12-16 NOTE — Progress Notes (Signed)
In for his monthly Abilify M 400 mg injection , given today in his R GLUTEAL muscle without issue. He was also seen today by [facility] NP for his three month assessment. He voices no complaints. He has a good personal appearance, though does have some body odor. Minimally verbal but does answer questions asked appropriately. He is to return in one month for his next shot. She is at his baseline today.

## 2020-12-16 NOTE — Progress Notes (Signed)
[facility] MD/PA/NP OP Progress Note  12/16/2020 2:57 PM Dale Calderon  MRN:  [id number]  Chief Complaint: Follow up for LAI Abilify Maintena injection  HPI: Dale Calderon, 40 y.o., male seen today for follow up and administration of long acting injectable (Abilify Maintena).  Today he is dressed appropriated for weather and well groomed.  He reports there has been minimal depression and anxiety.  He reports Long acting injectable (LAI) continues to help.  Reports there has been no mood fluctuations, racing thoughts, hallucinations (psychosis), or paranoia. He denies any signs/symptoms of mania.  He also denies suicidal/self-harm/homicidal ideation.  He also reports he has been eating/sleeping without difficulty and tolerating medications without any adverse reaction.  GAD-7, PHQ-9, CSRRS, and AIMS conducted by provider see scores below.  Patient reporting he lives alone but has a very supportive family.  States he spends most of his free time playing video games.    Visit Diagnosis:    ICD-10-CM   1. Schizoaffective disorder, bipolar type ([place])  F25.0 TSH    Lipid Profile    COMPLETE METABOLIC PANEL WITH GFR    HgB A1c    Magnesium    CBC with Differential    Lipid Panel With LDL/HDL Ratio    2. Polysubstance abuse ([place]) in the past  F19.10     3. Attention deficit hyperactivity disorder (ADHD), predominantly inattentive type  F90.0       Past Psychiatric History: Schizoaffective disorder, ADHD, polysubstance abuse in remission  Past Medical History:  Past Medical History:  Diagnosis Date   ADHD (attention deficit hyperactivity disorder)    History reviewed. No pertinent surgical history.  Family Psychiatric History: None reported  Family History: History reviewed. No pertinent family history.  Social History:  Social History   Socioeconomic History   Marital status: Single    Spouse name: Not on file   Number of children: Not on file   Years of education: Not on file   Highest  education level: Not on file  Occupational History   Not on file  Tobacco Use   Smoking status: Never   Smokeless tobacco: Never  Substance and Sexual Activity   Alcohol use: Yes    Comment: occ   Drug use: Yes    Types: Marijuana    Comment: opiates   Sexual activity: Not on file  Other Topics Concern   Not on file  Social History Narrative   ** Merged History Encounter **       Social Determinants of Health   Financial Resource Strain: Not on file  Food Insecurity: Not on file  Transportation Needs: Not on file  Physical Activity: Not on file  Stress: Not on file  Social Connections: Not on file    Allergies: No Known Allergies  Metabolic Disorder Labs: No results found for: HGBA1C, MPG No results found for: PROLACTIN No results found for: CHOL, TRIG, HDL, CHOLHDL, VLDL, LDLCALC No results found for: TSH  Therapeutic Level Labs: No results found for: LITHIUM No results found for: VALPROATE No components found for:  CBMZ  Current Medications: Current Outpatient Medications  Medication Sig Dispense Refill   albuterol (PROVENTIL HFA;VENTOLIN HFA) 108 (90 Base) MCG/ACT inhaler Inhale 2 puffs into the lungs every 6 (six) hours as needed for wheezing or shortness of breath.     amphetamine-dextroamphetamine (ADDERALL) 30 MG tablet Take 15 mg by mouth 2 (two) times daily.   0   Current Facility-Administered Medications  Medication Dose Route Frequency Provider  Last Rate Last Admin   ARIPiprazole ER (ABILIFY MAINTENA) 400 MG prefilled syringe 400 mg  400 mg Intramuscular Q28 days Caera Enwright B, NP   400 mg at 11/18/20 1457     Musculoskeletal: Strength & Muscle Tone: within normal limits Gait & Station: normal Patient leans: N/A  Psychiatric Specialty Exam: Review of Systems  Constitutional: Negative.   HENT: Negative.    Eyes: Negative.   Respiratory: Negative.    Cardiovascular: Negative.   Gastrointestinal: Negative.   Genitourinary: Negative.    Musculoskeletal: Negative.   Skin: Negative.   Neurological: Negative.   Hematological: Negative.   Psychiatric/Behavioral:  Negative for agitation, confusion, dysphoric mood, hallucinations and sleep disturbance. The patient is not nervous/anxious.    There were no vitals taken for this visit.There is no height or weight on file to calculate BMI.  General Appearance: Casual and Neat  Eye Contact:  Good  Speech:  Clear and Coherent and Normal Rate  Volume:  Normal  Mood:  Euthymic  Affect:  Appropriate and Congruent  Thought Process:  Coherent, Goal Directed, and Descriptions of Associations: Intact  Orientation:  Full (Time, Place, and Person)  Thought Content: WDL   Suicidal Thoughts:  No  Homicidal Thoughts:  No  Memory:  Immediate;   Good Recent;   Good Remote;   Good  Judgement:  Intact  Insight:  Present  Psychomotor Activity:  Normal  Concentration:  Concentration: Good and Attention Span: Good  Recall:  Good  Fund of Knowledge: Good  Language: Good  Akathisia:  No  Handed:  Right  AIMS (if indicated): done  Assets:  Communication Skills Desire for Improvement Housing Leisure Time Resilience Social Support  ADL's:  Intact  Cognition: WNL  Sleep:  Good   Screenings: [place] Office Visit from 06/01/2020 in [facility] Total Score 1      PHQ2-9    [place] Office Visit from 09/23/2020 in [facility] Office Visit from 06/01/2020 in [facility]  PHQ-2 Total Score 3 0  PHQ-9 Total Score 4 --      [place] Office Visit from 09/23/2020 in [facility] ED from 08/12/2020 in [place] Office Visit from 06/01/2020 in [place] No Risk Error: Question 2 not populated No Risk      Assessment and Plan: Patient appears to be doing well.  He  reports that medications are effective and managing his psychiatric condition.  During visit patient is sitting up right in chair in no acute distress.  He is alert/oriented x 4, calm/cooperative and mood is congruent with affect.  He spoke in a clear tone at moderate volume, and normal pace, with good eye contact.  His thought process is coherent and relevant; and there is no indication that he is currently responding to internal/external stimuli or experiencing delusional thought content.  Patient denies depression and anxiety.  He also denies suicidal/self-harm/homicidal ideation, psychosis, and paranoia.  He responded appropriately to questions asked during assessment.       1. Schizoaffective disorder, bipolar type ([place]) -IM Abilify Maintena 400 mg received today 12/16/20 - TSH - Lipid Profile - COMPLETE METABOLIC PANEL WITH GFR - HgB A1c - Magnesium - CBC with Differential - Lipid Panel With LDL/HDL Ratio  2. Polysubstance abuse ([place]) in the past   3. Attention deficit hyperactivity disorder (ADHD),  predominantly inattentive type   Follow up in one month    Semone Orlov, NP 12/16/2020, 2:57 PM

## 2021-01-13 ENCOUNTER — Ambulatory Visit (HOSPITAL_COMMUNITY): Payer: No Payment, Other

## 2021-01-18 ENCOUNTER — Telehealth (HOSPITAL_COMMUNITY): Payer: Self-pay | Admitting: *Deleted

## 2021-01-18 NOTE — Telephone Encounter (Signed)
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 He was rescheduled for Thurs the 15 th at 2pm.

## 2021-01-18 NOTE — Telephone Encounter (Signed)
Opened a second time in error. 

## 2021-01-20 ENCOUNTER — Other Ambulatory Visit: Payer: Self-pay

## 2021-01-20 ENCOUNTER — Encounter (HOSPITAL_COMMUNITY): Payer: Self-pay

## 2021-01-20 ENCOUNTER — Ambulatory Visit (HOSPITAL_COMMUNITY): Payer: No Payment, Other | Admitting: *Deleted

## 2021-01-20 VITALS — BP 135/93 | HR 83 | Ht 73.0 in | Wt 228.0 lb

## 2021-01-20 DIAGNOSIS — F250 Schizoaffective disorder, bipolar type: Secondary | ICD-10-CM

## 2021-01-20 NOTE — Progress Notes (Signed)
In a bit late for his injection, he forgot about it and Clinical research associate called to reschedule him for today. He drove himself in today. He completed his patient assistance paperwork to continue getting his Abilify shot here for free. He got his Abilify M 400 mg injection In his R GLUTEAL muscle without issue. He denies any issues. Told him I submitted his name to be reviewed for medicaid and disability as he doesn't work and his family supports him though he lives independently. He is glad for the referral. [place] NP ordered lab work for him and he was sent to [facility] from here to have it drawn.He is to return in one month for his next injection.

## 2021-01-21 LAB — CMP14+EGFR
ALT: 31 IU/L (ref 0–44)
AST: 20 IU/L (ref 0–40)
Albumin/Globulin Ratio: 1.7 (ref 1.2–2.2)
Albumin: 4.3 g/dL (ref 4.0–5.0)
Alkaline Phosphatase: 119 IU/L (ref 44–121)
BUN/Creatinine Ratio: 6 — ABNORMAL LOW (ref 9–20)
BUN: 7 mg/dL (ref 6–20)
Bilirubin Total: 0.7 mg/dL (ref 0.0–1.2)
CO2: 24 mmol/L (ref 20–29)
Calcium: 9.4 mg/dL (ref 8.7–10.2)
Chloride: 103 mmol/L (ref 96–106)
Creatinine, Ser: 1.22 mg/dL (ref 0.76–1.27)
Globulin, Total: 2.6 g/dL (ref 1.5–4.5)
Glucose: 101 mg/dL — ABNORMAL HIGH (ref 70–99)
Potassium: 4.2 mmol/L (ref 3.5–5.2)
Sodium: 142 mmol/L (ref 134–144)
Total Protein: 6.9 g/dL (ref 6.0–8.5)
eGFR: 77 mL/min/{1.73_m2} (ref >59)

## 2021-01-21 LAB — CBC WITH DIFFERENTIAL/PLATELET
Basophils Absolute: 0.1 10*3/uL (ref 0.0–0.2)
Basos: 1 %
EOS (ABSOLUTE): 0.5 10*3/uL — ABNORMAL HIGH (ref 0.0–0.4)
Eos: 6 %
Hematocrit: 51.1 % — ABNORMAL HIGH (ref 37.5–51.0)
Hemoglobin: 17.1 g/dL (ref 13.0–17.7)
Immature Grans (Abs): 0 10*3/uL (ref 0.0–0.1)
Immature Granulocytes: 0 %
Lymphocytes Absolute: 2.3 10*3/uL (ref 0.7–3.1)
Lymphs: 30 %
MCH: 29.9 pg (ref 26.6–33.0)
MCHC: 33.5 g/dL (ref 31.5–35.7)
MCV: 89 fL (ref 79–97)
Monocytes Absolute: 0.7 10*3/uL (ref 0.1–0.9)
Monocytes: 9 %
Neutrophils Absolute: 4.2 10*3/uL (ref 1.4–7.0)
Neutrophils: 54 %
Platelets: 286 10*3/uL (ref 150–450)
RBC: 5.72 x10E6/uL (ref 4.14–5.80)
RDW: 12.1 % (ref 11.6–15.4)
WBC: 7.7 10*3/uL (ref 3.4–10.8)

## 2021-01-21 LAB — LIPID PANEL
Chol/HDL Ratio: 3 ratio (ref 0.0–5.0)
Cholesterol, Total: 145 mg/dL (ref 100–199)
HDL: 49 mg/dL (ref >39)
LDL Chol Calc (NIH): 77 mg/dL (ref 0–99)
Triglycerides: 102 mg/dL (ref 0–149)
VLDL Cholesterol Cal: 19 mg/dL (ref 5–40)

## 2021-01-21 LAB — TSH: TSH: 7.03 u[IU]/mL — ABNORMAL HIGH (ref 0.450–4.500)

## 2021-01-21 LAB — HEMOGLOBIN A1C
Est. average glucose Bld gHb Est-mCnc: 111 mg/dL
Hgb A1c MFr Bld: 5.5 % (ref 4.8–5.6)

## 2021-01-21 LAB — MAGNESIUM: Magnesium: 2 mg/dL (ref 1.6–2.3)

## 2021-01-21 LAB — LIPID PANEL WITH LDL/HDL RATIO: LDL/HDL Ratio: 1.6 ratio (ref 0.0–3.6)

## 2021-02-04 ENCOUNTER — Other Ambulatory Visit (HOSPITAL_COMMUNITY): Payer: Self-pay | Admitting: Psychiatry

## 2021-02-17 ENCOUNTER — Ambulatory Visit (HOSPITAL_COMMUNITY): Payer: No Payment, Other

## 2021-03-01 ENCOUNTER — Other Ambulatory Visit: Payer: Self-pay

## 2021-03-01 ENCOUNTER — Ambulatory Visit (INDEPENDENT_AMBULATORY_CARE_PROVIDER_SITE_OTHER): Payer: No Payment, Other | Admitting: *Deleted

## 2021-03-01 ENCOUNTER — Encounter (HOSPITAL_COMMUNITY): Payer: Self-pay

## 2021-03-01 VITALS — BP 127/84 | HR 73 | Ht 73.0 in | Wt 238.0 lb

## 2021-03-01 DIAGNOSIS — F250 Schizoaffective disorder, bipolar type: Secondary | ICD-10-CM

## 2021-03-01 NOTE — Progress Notes (Signed)
In late for his shot, Clinical research associate called last week to reschedule his missed shot. He was initally scheduled for a shot on 02/18/20 but rescheduled for today. He drove himself in today as his father is not feeling well. He denies any issues, states hes been hanging out. He has a fair personal appearance, smells a bit of body odor. He got his shot today in his L GLUTEAL Muscle without issue. He is to return in one month and see a provider as well as get his shot.

## 2021-03-31 ENCOUNTER — Ambulatory Visit (HOSPITAL_COMMUNITY): Payer: No Payment, Other

## 2021-03-31 ENCOUNTER — Encounter (HOSPITAL_COMMUNITY): Payer: Self-pay

## 2021-03-31 ENCOUNTER — Ambulatory Visit (HOSPITAL_COMMUNITY): Payer: No Payment, Other | Admitting: *Deleted

## 2021-03-31 ENCOUNTER — Other Ambulatory Visit: Payer: Self-pay

## 2021-03-31 DIAGNOSIS — F250 Schizoaffective disorder, bipolar type: Secondary | ICD-10-CM

## 2021-03-31 NOTE — Progress Notes (Signed)
In as scheduled for his monthly injection of Abilify M 400 mg. Today he received his shot in his R GLUTEAL muscle without issue. He drove himself to his appt today. Reports everyone in the family is doing well and he is too. No complaints offered. He has a good personal appearance but he does have some body odor smell. He is more verbal than usual and appropriate. He was unable to see a provider today as the NP scheduled for today called out. He will be seen by the provider at his next appt. To return in one month.

## 2021-04-28 ENCOUNTER — Encounter (HOSPITAL_COMMUNITY): Payer: Self-pay | Admitting: Registered Nurse

## 2021-04-28 ENCOUNTER — Encounter (HOSPITAL_COMMUNITY): Payer: Self-pay

## 2021-04-28 ENCOUNTER — Ambulatory Visit (INDEPENDENT_AMBULATORY_CARE_PROVIDER_SITE_OTHER): Payer: No Payment, Other | Admitting: Registered Nurse

## 2021-04-28 ENCOUNTER — Ambulatory Visit (INDEPENDENT_AMBULATORY_CARE_PROVIDER_SITE_OTHER): Payer: No Payment, Other | Admitting: *Deleted

## 2021-04-28 ENCOUNTER — Other Ambulatory Visit: Payer: Self-pay

## 2021-04-28 VITALS — Ht 73.0 in | Wt 244.0 lb

## 2021-04-28 DIAGNOSIS — F900 Attention-deficit hyperactivity disorder, predominantly inattentive type: Secondary | ICD-10-CM

## 2021-04-28 DIAGNOSIS — F250 Schizoaffective disorder, bipolar type: Secondary | ICD-10-CM

## 2021-04-28 MED ORDER — ARIPIPRAZOLE ER 400 MG IM PRSY
400.0000 mg | PREFILLED_SYRINGE | Freq: Once | INTRAMUSCULAR | Status: AC
  Administered 2021-04-28: 400 mg via INTRAMUSCULAR

## 2021-04-28 NOTE — Progress Notes (Signed)
BH MD/PA/NP OP Progress Note ? ?04/28/2021 3:56 PM ?Dale Calderon  ?MRN:  [id number] ? ?Chief Complaint:  ?Chief Complaint   ?Follow up long acting injectable; Injections ?  ?Follow up for LAI Abilify Maintena injection ? ?HPI: Dale Calderon, 41 y.o., male with psychiatric history of schizoaffective disorder bipolar type.  He is managed with Abilify Maintena 400 mg monthly.  He is seen in office today for follow up or Abilify Maintena.  Patient reports that his medication is working well with no adverse reactions.  Patient states he had been doing well.  He denies depression, anxiety, and mood instability.  He also denies suicidal/self-harm/homicidal ideation, psychosis, and paranoia.  "I don't have any of that no more."  GAD-7, PHQ-9, CSRRS, and AIMS conducted by provider see scores below.  Patient states he is eating and sleeping without difficulty.  Patient reports he continues to take Adderall that is prescribed by his primary physician.  ? ?Visit Diagnosis:  ?  ICD-10-CM   ?1. Schizoaffective disorder, bipolar type (HCC)  F25.0 ARIPiprazole ER (ABILIFY MAINTENA) 400 MG prefilled syringe 400 mg  ?  ?2. Attention deficit hyperactivity disorder (ADHD), predominantly inattentive type  F90.0   ?  ? ? ?Past Psychiatric History: Schizoaffective disorder, ADHD, polysubstance abuse in remission ? ?Past Medical History:  ?Past Medical History:  ?Diagnosis Date  ? ADHD (attention deficit hyperactivity disorder)   ? History reviewed. No pertinent surgical history. ? ?Family Psychiatric History: None reported ? ?Family History: History reviewed. No pertinent family history. ? ?Social History:  ?Social History  ? ?Socioeconomic History  ? Marital status: Single  ?  Spouse name: Not on file  ? Number of children: Not on file  ? Years of education: Not on file  ? Highest education level: Not on file  ?Occupational History  ? Not on file  ?Tobacco Use  ? Smoking status: Never  ? Smokeless tobacco: Never  ?Substance and Sexual  Activity  ? Alcohol use: Yes  ?  Comment: occ  ? Drug use: Yes  ?  Types: Marijuana  ?  Comment: opiates  ? Sexual activity: Not on file  ?Other Topics Concern  ? Not on file  ?Social History Narrative  ? ** Merged History Encounter **  ?    ? ?Social Determinants of Health  ? ?Financial Resource Strain: Not on file  ?Food Insecurity: Not on file  ?Transportation Needs: Not on file  ?Physical Activity: Not on file  ?Stress: Not on file  ?Social Connections: Not on file  ? ? ?Allergies: No Known Allergies ? ?Metabolic Disorder Labs: ?Lab Results  ?Component Value Date  ? HGBA1C 5.5 01/20/2021  ? ?No results found for: PROLACTIN ?Lab Results  ?Component Value Date  ? CHOL 145 01/20/2021  ? TRIG 102 01/20/2021  ? HDL 49 01/20/2021  ? CHOLHDL 3.0 01/20/2021  ? LDLCALC 77 01/20/2021  ? ?Lab Results  ?Component Value Date  ? TSH 7.030 (H) 01/20/2021  ? ? ?Therapeutic Level Labs: ?No results found for: LITHIUM ?No results found for: VALPROATE ?No components found for:  CBMZ ? ?Current Medications: ?Current Outpatient Medications  ?Medication Sig Dispense Refill  ? ABILIFY MAINTENA 400 MG SRER injection INJECT 400MG  INTRAMUSCULARLY EVERY 28 DAYS AS DIRECTED (USE WITHIN 30 MINUTES AFTER RECONSTITUTION) 1 each 0  ? albuterol (PROVENTIL HFA;VENTOLIN HFA) 108 (90 Base) MCG/ACT inhaler Inhale 2 puffs into the lungs every 6 (six) hours as needed for wheezing or shortness of breath.    ?  amphetamine-dextroamphetamine (ADDERALL) 30 MG tablet Take 15 mg by mouth 2 (two) times daily.   0  ? ?Current Facility-Administered Medications  ?Medication Dose Route Frequency Provider Last Rate Last Admin  ? ARIPiprazole ER (ABILIFY MAINTENA) 400 MG prefilled syringe 400 mg  400 mg Intramuscular Q28 days Marqueta Pulley B, NP   400 mg at 03/31/21 1453  ? ? ? ?Musculoskeletal: ?Strength & Muscle Tone: within normal limits ?Gait & Station: normal ?Patient leans: N/A ? ?Psychiatric Specialty Exam: ?Review of Systems  ?Constitutional: Negative.    ?HENT: Negative.    ?Eyes: Negative.   ?Respiratory: Negative.    ?Cardiovascular: Negative.   ?Gastrointestinal: Negative.   ?Genitourinary: Negative.   ?Musculoskeletal: Negative.   ?Skin: Negative.   ?Neurological: Negative.   ?Hematological: Negative.   ?Psychiatric/Behavioral:  Negative for agitation, confusion, dysphoric mood, hallucinations, self-injury and sleep disturbance. The patient is not nervous/anxious.    ?Height 6\' 1"  (1.854 m), weight 244 lb (110.7 kg).Body mass index is 32.19 kg/m?.  ?General Appearance: Casual and Neat  ?Eye Contact:  Good  ?Speech:  Clear and Coherent and Normal Rate  ?Volume:  Normal  ?Mood:  Euthymic  ?Affect:  Appropriate and Congruent  ?Thought Process:  Coherent, Goal Directed, and Descriptions of Associations: Intact  ?Orientation:  Full (Time, Place, and Person)  ?Thought Content: WDL   ?Suicidal Thoughts:  No  ?Homicidal Thoughts:  No  ?Memory:  Immediate;   Good ?Recent;   Good ?Remote;   Good  ?Judgement:  Intact  ?Insight:  Present  ?Psychomotor Activity:  Normal  ?Concentration:  Concentration: Good and Attention Span: Good  ?Recall:  Good  ?Fund of Knowledge: Good  ?Language: Good  ?Akathisia:  No  ?Handed:  Right  ?AIMS (if indicated): done  ?Assets:  Communication Skills ?Desire for Improvement ?Housing ?Leisure Time ?Resilience ?Social Support  ?ADL's:  Intact  ?Cognition: WNL  ?Sleep:  Good  ? ?Screenings: ?AIMS   ? ?Flowsheet Row Office Visit from 04/28/2021 in [facility] Office Visit from 06/01/2020 in [facility]  ?AIMS Total Score 0 1  ? ?  ? ?GAD-7   ? ?Flowsheet Row Office Visit from 04/28/2021 in [facility]  ?Total GAD-7 Score 0  ? ?  ? ?PHQ2-9   ? ?Flowsheet Row Office Visit from 04/28/2021 in [facility] Office Visit from 09/23/2020 in [facility] Office Visit from 06/01/2020 in [facility]  ?PHQ-2 Total Score 0 3 0  ?PHQ-9 Total Score -- 4 --  ? ?  ? ?Flowsheet Row Office Visit from 04/28/2021 in [facility] Office Visit from 09/23/2020 in [facility] ED from 08/12/2020 in [facility] EMERGENCY DEPARTMENT  ?C-SSRS RISK CATEGORY No Risk No Risk Error: Question 2 not populated  ? ?  ? ?Assessment and Plan:  ?Patient appears to be doing well.  He reports that medications are effective and managing his psychiatric condition.  During visit patient is sitting upright in chair with no noted distress.  He is dressed appropriate for weather and alert/oriented x 4.  He is calm, cooperative, and mood is congruent with affect.  He spoke in a clear tone at moderate volume, and normal pace, with good eye contact.  His thought process is coherent and relevant; and there is no indication that he is currently responding to internal/external stimuli or experiencing delusional thought content.  Patient  denies depression, anxiety, and mood instability.  He also denies suicidal/self-harm/homicidal ideation, psychosis, and paranoia.   ? ?1. Schizoaffective disorder, bipolar type (HCC) ?-IM Abilify Maintena 400 mg received today 04/28/2021 ? ?2. Polysubstance abuse (HCC) in the past ? ? ?3. Attention deficit hyperactivity disorder (ADHD), predominantly inattentive type ? ? ?Follow up in one month  ? ?Assunta FoundShuvon Sheral Pfahler, NP ?04/28/2021, 3:56 PM ? ?

## 2021-04-28 NOTE — Progress Notes (Signed)
In an hour late for his shot. He drove himself here. States he had an appt earlier today with his PCP, and got a good report from the Dr. He offers no complaints today. He is at his baseline, little spontaneous conversation, appropriate to questions asked and has a good appearance but he does have body odor. He got his shot today in his L GLUTEAL muscle without issue. He is to return in one month for his next shot.  ?

## 2021-05-26 ENCOUNTER — Ambulatory Visit (INDEPENDENT_AMBULATORY_CARE_PROVIDER_SITE_OTHER): Payer: No Payment, Other | Admitting: *Deleted

## 2021-05-26 VITALS — BP 137/103 | HR 96 | Ht 73.0 in | Wt 234.0 lb

## 2021-05-26 DIAGNOSIS — F250 Schizoaffective disorder, bipolar type: Secondary | ICD-10-CM | POA: Diagnosis not present

## 2021-05-26 NOTE — Progress Notes (Signed)
In as scheduled for his monthly abilify m 400 mg injection.He got his shot today in his R GLUTEAL muscle without issue.  He is spontaneous today and more verbal. He has an improved personal appearance, he has shaved his beard. He offers no complaints. His BP is elevated today, he does have a PCP he has seen in the last 6 months and has a return appt to see his medical provider. He said the PCP commented on his lab work that one of his values was off but he did not make any changes to any medicines and told him it was nothing to worry about. Shuvon NP reviewed his labs and his TSH is elevated. Patient has appt in future with MD to follow up.  ?

## 2021-05-26 NOTE — Progress Notes (Signed)
Patient aware of elevated TSH and has follow up with his primary care physician.  Reports that PCP drew more blood and will follow up in 6 months.  Reporting no signs or symptoms. ? ? ?Tashawna Thom B. Kael Keetch, NP  ?

## 2021-06-23 ENCOUNTER — Encounter (HOSPITAL_COMMUNITY): Payer: Self-pay

## 2021-06-23 ENCOUNTER — Ambulatory Visit (INDEPENDENT_AMBULATORY_CARE_PROVIDER_SITE_OTHER): Payer: No Payment, Other | Admitting: *Deleted

## 2021-06-23 VITALS — BP 138/102 | HR 89 | Ht 73.0 in | Wt 241.0 lb

## 2021-06-23 DIAGNOSIS — F250 Schizoaffective disorder, bipolar type: Secondary | ICD-10-CM | POA: Diagnosis not present

## 2021-06-23 NOTE — Progress Notes (Signed)
In as planned for his monthly injection of Abilify M 400 mg. Today he got his shot in her L GLUTEAL muscle. He is verbal and appropriate today. More spontaneous than he sometimes is. Shared his dad just took a trip out to [facility], won some money and had a good trip. He states he has been "hanging out" today he was cleaning out the pool. He is to return in one month for his next injection. He offers no complaints.

## 2021-07-21 ENCOUNTER — Encounter (HOSPITAL_COMMUNITY): Payer: Self-pay

## 2021-07-21 ENCOUNTER — Ambulatory Visit (INDEPENDENT_AMBULATORY_CARE_PROVIDER_SITE_OTHER): Payer: No Payment, Other | Admitting: *Deleted

## 2021-07-21 VITALS — BP 139/105 | HR 102 | Ht 73.0 in | Wt 245.0 lb

## 2021-07-21 DIAGNOSIS — F250 Schizoaffective disorder, bipolar type: Secondary | ICD-10-CM | POA: Diagnosis not present

## 2021-07-21 NOTE — Progress Notes (Signed)
In as scheduled for his monthly injection of Abilify M 400 mg given today in his R GLUTEAL muscle without difficutly. He offers not complaints when asked, states he is "the same". He is to return in 28 days for his next shot.

## 2021-07-22 ENCOUNTER — Other Ambulatory Visit (HOSPITAL_COMMUNITY): Payer: Self-pay | Admitting: Psychiatry

## 2021-07-22 MED ORDER — ABILIFY MAINTENA 400 MG IM SRER: 400.0000 mg | INTRAMUSCULAR | 11 refills | Status: DC

## 2021-07-26 ENCOUNTER — Other Ambulatory Visit (HOSPITAL_COMMUNITY): Payer: Self-pay | Admitting: Registered Nurse

## 2021-07-26 DIAGNOSIS — F250 Schizoaffective disorder, bipolar type: Secondary | ICD-10-CM

## 2021-07-26 MED ORDER — ABILIFY MAINTENA 400 MG IM SRER: 400.0000 mg | INTRAMUSCULAR | 11 refills | Status: DC

## 2021-08-18 ENCOUNTER — Telehealth (HOSPITAL_COMMUNITY): Payer: Self-pay | Admitting: *Deleted

## 2021-08-18 ENCOUNTER — Ambulatory Visit (HOSPITAL_COMMUNITY): Payer: No Payment, Other

## 2021-08-18 NOTE — Telephone Encounter (Signed)
Called patients number and left message with the person who answered to ask him to come in next week on tues or thrus for his shot as he no showed today for his scheduled appt.

## 2021-09-08 ENCOUNTER — Ambulatory Visit (INDEPENDENT_AMBULATORY_CARE_PROVIDER_SITE_OTHER): Payer: No Payment, Other | Admitting: *Deleted

## 2021-09-08 ENCOUNTER — Encounter (HOSPITAL_COMMUNITY): Payer: Self-pay

## 2021-09-08 VITALS — BP 134/100 | HR 112 | Ht 73.0 in

## 2021-09-08 DIAGNOSIS — F250 Schizoaffective disorder, bipolar type: Secondary | ICD-10-CM | POA: Diagnosis not present

## 2021-09-08 NOTE — Progress Notes (Signed)
In late for his monthly injection of Abilify M 400 mg, given today in his L GLUTEAL. He states he is late getting his shot because he went to the beach last minute and has been busy. He offers no complaints, states he is doing well. He has a good personal appearance today. He is to return in 28 days.

## 2021-10-05 ENCOUNTER — Ambulatory Visit (HOSPITAL_COMMUNITY): Payer: No Payment, Other

## 2021-10-06 ENCOUNTER — Ambulatory Visit (HOSPITAL_COMMUNITY): Payer: No Payment, Other

## 2021-10-06 ENCOUNTER — Ambulatory Visit (HOSPITAL_COMMUNITY): Payer: Self-pay

## 2021-10-13 ENCOUNTER — Ambulatory Visit (INDEPENDENT_AMBULATORY_CARE_PROVIDER_SITE_OTHER): Payer: No Payment, Other | Admitting: Registered Nurse

## 2021-10-13 ENCOUNTER — Ambulatory Visit (INDEPENDENT_AMBULATORY_CARE_PROVIDER_SITE_OTHER): Payer: No Payment, Other | Admitting: *Deleted

## 2021-10-13 ENCOUNTER — Encounter (HOSPITAL_COMMUNITY): Payer: Self-pay | Admitting: Registered Nurse

## 2021-10-13 ENCOUNTER — Encounter (HOSPITAL_COMMUNITY): Payer: Self-pay

## 2021-10-13 DIAGNOSIS — F250 Schizoaffective disorder, bipolar type: Secondary | ICD-10-CM | POA: Diagnosis not present

## 2021-10-13 DIAGNOSIS — F900 Attention-deficit hyperactivity disorder, predominantly inattentive type: Secondary | ICD-10-CM | POA: Diagnosis not present

## 2021-10-13 MED ORDER — ABILIFY ASIMTUFII 960 MG/3.2ML IM PRSY: 960.0000 mg | PREFILLED_SYRINGE | INTRAMUSCULAR | 3 refills | Status: DC

## 2021-10-13 MED ORDER — ARIPIPRAZOLE ER 400 MG IM PRSY
400.0000 mg | PREFILLED_SYRINGE | INTRAMUSCULAR | Status: AC
  Administered 2021-10-13: 400 mg via INTRAMUSCULAR

## 2021-10-13 NOTE — Progress Notes (Signed)
In as scheduled for his monthly inj of Abilify M 400 mg, given today in his R GLUTEAL Muscle. Discussed with him and he also spoke with the NP today about changing his Maintena to Asimptufii as he has been stable for two years and is compliant with his appt. He would like to change to 2 month shot so he doesn't have to come as often, he lives in [place] and its a bit of a drive for him. He is to return in 28 days. Completed patient assistance paperwork for the Abilify Asimptufii shot. He is at his baseline.

## 2021-10-13 NOTE — Progress Notes (Signed)
BH MD/PA/NP OP Progress Note  10/13/2021 2:12 PM Dale Calderon  MRN:  [id number]  Chief Complaint:  Chief Complaint  Patient presents with   Follow up psychiatric evaluation and administration of long   HPI: Dale Calderon, 12 yr. old male seen face to face in office today for follow-up psychiatric evaluation and administration of long acting injectable.  His psychiatric history includes schizoaffective disorder bipolar type.  His mental health is managed with Abilify Maintena 400 mg monthly.  He reports his medication continues to manage his mental health well without adverse reaction.  He denies depression, anxiety, fluctuations in mood, abnormal movement, suicidal/self-harm/homicidal ideation, psychosis, and paranoia.  AIMS, PHQ 2 & 9, C-SSRS, and GAD 7 screenings conducted by provider see scores below.  He reports that he is eating and sleeping without difficulty.   Reports he has been doing well.  Talks about his sister that has been living with him for a while but now looking for her own place.  States he is in the process of reapplying for disability.   Visit Diagnosis:    ICD-10-CM   1. Schizoaffective disorder, bipolar type (HCC)  F25.0 ARIPiprazole ER (ABILIFY ASIMTUFII) 960 MG/3.2ML PRSY    2. Attention deficit hyperactivity disorder (ADHD), predominantly inattentive type  F90.0       Past Psychiatric History:  Schizoaffective disorder, ADHD, polysubstance abuse in remission  Past Medical History:  Past Medical History:  Diagnosis Date   ADHD (attention deficit hyperactivity disorder)    History reviewed. No pertinent surgical history.  Family Psychiatric History: None reported  Family History: History reviewed. No pertinent family history.  Social History:  Social History   Socioeconomic History   Marital status: Single    Spouse name: Not on file   Number of children: Not on file   Years of education: Not on file   Highest education level: Not on file  Occupational  History   Not on file  Tobacco Use   Smoking status: Never   Smokeless tobacco: Never  Substance and Sexual Activity   Alcohol use: Yes    Comment: occ   Drug use: Yes    Types: Marijuana    Comment: opiates   Sexual activity: Not on file  Other Topics Concern   Not on file  Social History Narrative   ** Merged History Encounter **       Social Determinants of Health   Financial Resource Strain: Not on file  Food Insecurity: Not on file  Transportation Needs: Not on file  Physical Activity: Not on file  Stress: Not on file  Social Connections: Not on file    Allergies: No Known Allergies  Metabolic Disorder Labs: Lab Results  Component Value Date   HGBA1C 5.5 01/20/2021   No results found for: "PROLACTIN" Lab Results  Component Value Date   CHOL 145 01/20/2021   TRIG 102 01/20/2021   HDL 49 01/20/2021   CHOLHDL 3.0 01/20/2021   LDLCALC 77 01/20/2021   Lab Results  Component Value Date   TSH 7.030 (H) 01/20/2021    Therapeutic Level Labs: No results found for: "LITHIUM" No results found for: "VALPROATE" No results found for: "CBMZ"  Current Medications: Current Outpatient Medications  Medication Sig Dispense Refill   ARIPiprazole ER (ABILIFY ASIMTUFII) 960 MG/3.2ML PRSY Inject 960 mg into the muscle every 2 (two) months. 3.2 mL 3   albuterol (PROVENTIL HFA;VENTOLIN HFA) 108 (90 Base) MCG/ACT inhaler Inhale 2 puffs into the lungs every 6 (  six) hours as needed for wheezing or shortness of breath.     amphetamine-dextroamphetamine (ADDERALL) 30 MG tablet Take 15 mg by mouth 2 (two) times daily.   0   ARIPiprazole ER (ABILIFY MAINTENA) 400 MG SRER injection Inject 2 mLs (400 mg total) into the muscle every 28 (twenty-eight) days. Max 1 injection per 28 days dispense brand name only 1 each 11   No current facility-administered medications for this visit.     Musculoskeletal: Strength & Muscle Tone: within normal limits Gait & Station: normal Patient  leans: N/A  Psychiatric Specialty Exam: Review of Systems  Constitutional: Negative.   HENT: Negative.    Eyes: Negative.   Respiratory: Negative.    Cardiovascular: Negative.   Gastrointestinal: Negative.   Genitourinary: Negative.   Musculoskeletal: Negative.   Skin: Negative.   Neurological: Negative.   Hematological: Negative.   Psychiatric/Behavioral: Negative.  Negative for agitation, behavioral problems, confusion, dysphoric mood, hallucinations, self-injury, sleep disturbance and suicidal ideas. The patient is not nervous/anxious and is not hyperactive.     There were no vitals taken for this visit.There is no height or weight on file to calculate BMI.  General Appearance: Casual and Neat  Eye Contact:  Good  Speech:  Clear and Coherent and Normal Rate  Volume:  Normal  Mood:  Euthymic  Affect:  Appropriate and Congruent  Thought Process:  Coherent, Goal Directed, and Descriptions of Associations: Intact  Orientation:  Full (Time, Place, and Person)  Thought Content: Logical   Suicidal Thoughts:  No  Homicidal Thoughts:  No  Memory:  Immediate;   Good Recent;   Good Remote;   Good  Judgement:  Intact  Insight:  Present  Psychomotor Activity:  Normal  Concentration:  Concentration: Good and Attention Span: Good  Recall:  Good  Fund of Knowledge: Good  Language: Good  Akathisia:  No  Handed:  Right  AIMS (if indicated): done  Assets:  Communication Skills Desire for Improvement Housing Leisure Time Resilience Social Support Transportation  ADL's:  Intact  Cognition: WNL  Sleep:  Good   Screenings: AIMS    Flowsheet Row Office Visit from 04/28/2021 in [facility] Office Visit from 06/01/2020 in [facility]  AIMS Total Score 0 1      GAD-7    Flowsheet Row Office Visit from 10/13/2021 in [facility] Office Visit from 04/28/2021 in [facility]  Total GAD-7 Score 0 0      PHQ2-9    Flowsheet Row Office Visit from 10/13/2021 in [facility] Office Visit from 04/28/2021 in [facility] Office Visit from 09/23/2020 in [facility] Office Visit from 06/01/2020 in [facility]  PHQ-2 Total Score 0 0 3 0  PHQ-9 Total Score -- -- 4 --      Flowsheet Row Office Visit from 10/13/2021 in [facility] Office Visit from 04/28/2021 in [facility] Office Visit from 09/23/2020 in [facility]  C-SSRS RISK CATEGORY No Risk No Risk No Risk        Assessment and Plan: Dale Calderon appears to be doing well.  He reports his medication is effective and managing his mental health without any adverse reaction.  He voices interest in switching to Abilify Asimtufii.  Education on efficacy and symptoms of Abilify Asimtufii, understanding voiced and agrees to  starting on next visit.  Ordered Abilify Asimtufii 960 mg IM to be given bi-monthly.  During visit Dale Calderon is dressed appropriate for weather.  He is sitting upright in chair with no noted distress.  He is alert/oriented x 4, calm/cooperative and mood is congruent with affect.  He spoke in a clear tone at moderate volume, and normal pace, with good eye contact.  His thought process is coherent, relevant; and there is no indication that he is currently responding to internal/external stimuli, or experiencing delusional thought content.  He denies depression, anxiety, suicidal/self-harm/homicidal ideation, psychosis, paranoia, and fluctuations in mood.    Collaboration of Care: Collaboration of Care: Medication Management AEB Administration of long acting injection and medication refill and Psychiatrist AEB Follow up psychiatric evaluation.  1. Schizoaffective disorder, bipolar type (HCC) - ARIPiprazole ER (ABILIFY  ASIMTUFII) 960 MG/3.2ML PRSY; Inject 960 mg into the muscle every 2 (two) months.  Dispense: 3.2 mL; Refill: 3  2. Attention deficit hyperactivity disorder (ADHD), predominantly inattentive type    Patient/Guardian was advised Release of Information must be obtained prior to any record release in order to collaborate their care with an outside provider. Patient/Guardian was advised if they have not already done so to contact the registration department to sign all necessary forms in order for [place] to release information regarding their care.   Consent: Patient/Guardian gives verbal consent for treatment and assignment of benefits for services provided during this visit. Patient/Guardian expressed understanding and agreed to proceed.    July Linam, NP 10/13/2021, 2:12 PM

## 2021-11-10 ENCOUNTER — Ambulatory Visit (HOSPITAL_COMMUNITY): Payer: No Payment, Other

## 2021-11-29 ENCOUNTER — Encounter (HOSPITAL_COMMUNITY): Payer: Self-pay

## 2021-11-29 ENCOUNTER — Ambulatory Visit (INDEPENDENT_AMBULATORY_CARE_PROVIDER_SITE_OTHER): Payer: No Payment, Other | Admitting: *Deleted

## 2021-11-29 VITALS — BP 124/89 | HR 94 | Ht 73.0 in | Wt 244.0 lb

## 2021-11-29 DIAGNOSIS — F250 Schizoaffective disorder, bipolar type: Secondary | ICD-10-CM

## 2021-11-29 MED ORDER — ARIPIPRAZOLE ER 400 MG IM PRSY
400.0000 mg | PREFILLED_SYRINGE | Freq: Once | INTRAMUSCULAR | Status: AC
  Administered 2021-11-29: 400 mg via INTRAMUSCULAR

## 2021-11-29 NOTE — Progress Notes (Signed)
Patient ID: Dale Calderon, male   DOB: 1980/11/16, 41 y.o.   MRN: [id number] Seen late today for his monthly inj. Of Abilify M 400 mg, given today in his R GLUTEAL. He is animated today, states feels good today. Discussed importance of keeping his appts and not being consistantly late or he cant expect to stay well. He is to return in 28 days.

## 2021-12-28 ENCOUNTER — Ambulatory Visit (HOSPITAL_COMMUNITY): Payer: No Payment, Other | Admitting: *Deleted

## 2021-12-28 ENCOUNTER — Encounter (HOSPITAL_COMMUNITY): Payer: Self-pay

## 2021-12-28 VITALS — BP 118/95 | HR 90 | Ht 73.0 in | Wt 247.0 lb

## 2021-12-28 DIAGNOSIS — F250 Schizoaffective disorder, bipolar type: Secondary | ICD-10-CM

## 2021-12-28 MED ORDER — ARIPIPRAZOLE ER 400 MG IM PRSY
400.0000 mg | PREFILLED_SYRINGE | Freq: Once | INTRAMUSCULAR | Status: AC
  Administered 2021-12-28: 400 mg via INTRAMUSCULAR

## 2021-12-28 NOTE — Progress Notes (Signed)
PATIENT ARRIVED FOR INJECTION -- DMc-RMA   ARIPiprazole ER (ABILIFY MAINTENA) 400 MG  TOLERATED WELL IN LEFT GLUTEAL NO HI/SI NOR  AH/VH STATED HE'S DOING WELL PLEASANT AS ALWAYS  & SHARED FAMILY THANKSGIVING PLANS.

## 2022-01-26 ENCOUNTER — Ambulatory Visit (HOSPITAL_COMMUNITY): Payer: No Payment, Other

## 2022-01-26 ENCOUNTER — Encounter (HOSPITAL_COMMUNITY): Payer: Self-pay

## 2022-01-26 VITALS — BP 129/118 | HR 125 | Ht 73.0 in | Wt 247.0 lb

## 2022-01-26 DIAGNOSIS — F250 Schizoaffective disorder, bipolar type: Secondary | ICD-10-CM

## 2022-01-26 DIAGNOSIS — F1910 Other psychoactive substance abuse, uncomplicated: Secondary | ICD-10-CM

## 2022-01-26 DIAGNOSIS — F900 Attention-deficit hyperactivity disorder, predominantly inattentive type: Secondary | ICD-10-CM

## 2022-01-26 MED ORDER — ARIPIPRAZOLE ER 400 MG IM PRSY
400.0000 mg | PREFILLED_SYRINGE | Freq: Once | INTRAMUSCULAR | Status: AC
  Administered 2022-01-26: 400 mg via INTRAMUSCULAR

## 2022-01-26 NOTE — Progress Notes (Signed)
PATIENT ARRIVED FOR INJECTION -- DMc-RMA   ARIPiprazole ER (ABILIFY MAINTENA) 400 MG  TOLERATED WELL IN LEFT GLUTEAL NO HI/SI NOR  AH/VH STATED HE'S DOING WELL

## 2022-02-23 ENCOUNTER — Ambulatory Visit (HOSPITAL_COMMUNITY): Payer: No Payment, Other

## 2022-03-27 ENCOUNTER — Other Ambulatory Visit: Payer: Self-pay

## 2022-03-27 ENCOUNTER — Emergency Department (HOSPITAL_BASED_OUTPATIENT_CLINIC_OR_DEPARTMENT_OTHER)
Admission: EM | Admit: 2022-03-27 | Discharge: 2022-03-27 | Disposition: A | Discharge status: Home / Self Care | Payer: Medicaid Other | Attending: Emergency Medicine | Admitting: Emergency Medicine

## 2022-03-27 ENCOUNTER — Encounter (HOSPITAL_BASED_OUTPATIENT_CLINIC_OR_DEPARTMENT_OTHER): Payer: Self-pay

## 2022-03-27 ENCOUNTER — Emergency Department (HOSPITAL_BASED_OUTPATIENT_CLINIC_OR_DEPARTMENT_OTHER): Payer: Medicaid Other

## 2022-03-27 DIAGNOSIS — M25512 Pain in left shoulder: Secondary | ICD-10-CM

## 2022-03-27 DIAGNOSIS — M79602 Pain in left arm: Secondary | ICD-10-CM

## 2022-03-27 MED ORDER — PREDNISONE 20 MG PO TABS: ORAL_TABLET | ORAL | 0 refills | Status: AC

## 2022-03-27 MED ORDER — KETOROLAC TROMETHAMINE 15 MG/ML IJ SOLN
15.0000 mg | Freq: Once | INTRAMUSCULAR | Status: AC
  Administered 2022-03-27: 15 mg via INTRAMUSCULAR
  Filled 2022-03-27: qty 1

## 2022-03-27 NOTE — ED Triage Notes (Signed)
C/o left shoulder pain in joint and pain radiating to bicep/tricep x 2 days, pain worse with movement. Denies chest pain/sob. Unsure of injury, states he thinks he slept on it wrong & feels like "nerve pain".

## 2022-03-27 NOTE — Discharge Instructions (Signed)
Please read and follow all provided instructions.  Your diagnoses today include:  1. Left arm pain   2. Acute pain of left shoulder     Tests performed today include: An x-ray of the affected area - does NOT show any broken bones EKG does not show any problems with heart Vital signs. See below for your results today.   Medications prescribed:  Prednisone - steroid medicine   It is best to take this medication in the morning to prevent sleeping problems. If you are diabetic, monitor your blood sugar closely and stop taking Prednisone if blood sugar is over 300. Take with food to prevent stomach upset.   Take any prescribed medications only as directed.  Home care instructions:  Follow any educational materials contained in this packet Follow R.I.C.E. Protocol: R - rest your injury  I  - use ice on injury without applying directly to skin C - compress injury with bandage or splint E - elevate the injury as much as possible  Follow-up instructions: Please follow-up with your primary care provider or the provided orthopedic physician (bone specialist) in 1 week.   Return instructions:  Please return if your fingers are numb or tingling, appear gray or blue, or you have severe pain (also elevate the arm and loosen splint or wrap if you were given one) Please return to the Emergency Department if you experience worsening symptoms.  Please return if you have any other emergent concerns.  Additional Information:  Your vital signs today were: BP (!) 140/103 (BP Location: Left Arm)   Pulse (!) 111   Temp 98.1 F (36.7 C) (Oral)   Resp 18   Ht 6' 1"$  (1.854 m)   Wt 108.9 kg   SpO2 96%   BMI 31.66 kg/m  If your blood pressure (BP) was elevated above 135/85 this visit, please have this repeated by your doctor within one month. --------------

## 2022-03-27 NOTE — ED Provider Notes (Signed)
Dale Calderon Provider Note   CSN: [id number] Arrival date & time: 03/27/22  [id number]     History  Chief Complaint  Patient presents with   Shoulder Pain    Dale Calderon is a 42 y.o. male.  Patient with history of schizoaffective disorder presents to the emergency department today for evaluation of shoulder pain.  Patient reports having episodes of numbness and tingling in his left forearm and hand over the past several months.  He was told by family member that it could be carpal tunnel.  He states that over the past 2 days he has had increasing pain in the shoulder and upper arm.  He states no injuries but does work as a Training and development officer.  He thinks that he slept on the area wrong.  Pain is shooting in nature.  No neck pain.  No weakness.  No lower extremity symptoms.  Patient has been taking ibuprofen at home every 4 hours for pain, last dose midnight.  Denies associated chest pain or shortness of breath.       Home Medications Prior to Admission medications   Medication Sig Start Date End Date Taking? Authorizing Provider  predniSONE (DELTASONE) 20 MG tablet 3 Tabs PO Days 1-3, then 2 tabs PO Days 4-6, then 1 tab PO Day 7-9, then Half Tab PO Day 10-12 03/27/22  Yes Carlisle Cater, PA-C  albuterol (PROVENTIL HFA;VENTOLIN HFA) 108 (90 Base) MCG/ACT inhaler Inhale 2 puffs into the lungs every 6 (six) hours as needed for wheezing or shortness of breath.    [provider]  amphetamine-dextroamphetamine (ADDERALL) 30 MG tablet Take 15 mg by mouth 2 (two) times daily.  07/05/17   [provider]  ARIPiprazole ER (ABILIFY ASIMTUFII) 960 MG/3.2ML PRSY Inject 960 mg into the muscle every 2 (two) months. 10/13/21   Rankin, Shuvon B, NP  ARIPiprazole ER (ABILIFY MAINTENA) 400 MG SRER injection Inject 2 mLs (400 mg total) into the muscle every 28 (twenty-eight) days. Max 1 injection per 28 days dispense brand name only 07/26/21   Rankin, Shuvon B, NP       Allergies    Patient has no known allergies.    Review of Systems   Review of Systems  Physical Exam Updated Vital Signs BP (!) 140/103 (BP Location: Left Arm)   Pulse (!) 111   Temp 98.1 F (36.7 C) (Oral)   Resp 18   Ht 6' 1"$  (1.854 m)   Wt 108.9 kg   SpO2 96%   BMI 31.66 kg/m  Physical Exam Vitals and nursing note reviewed.  Constitutional:      General: He is not in acute distress.    Appearance: He is well-developed.  HENT:     Head: Normocephalic and atraumatic.  Eyes:     General:        Right eye: No discharge.        Left eye: No discharge.     Conjunctiva/sclera: Conjunctivae normal.  Cardiovascular:     Rate and Rhythm: Normal rate and regular rhythm.     Pulses:          Radial pulses are 2+ on the left side.     Heart sounds: Normal heart sounds.  Pulmonary:     Effort: Pulmonary effort is normal.     Breath sounds: Normal breath sounds.  Abdominal:     Palpations: Abdomen is soft.     Tenderness: There is no abdominal tenderness.  Musculoskeletal:  Left shoulder: No tenderness or bony tenderness. Normal range of motion.     Left upper arm: No swelling, tenderness or bony tenderness.     Left elbow: Normal range of motion.     Cervical back: Normal range of motion and neck supple.     Comments: Patient's left shoulder tenderness is not really reproducible with palpation or movement.  No significant swelling noted.  Distal sensation and circulation intact.  Skin:    General: Skin is warm and dry.  Neurological:     Mental Status: He is alert.     ED Results / Procedures / Treatments   Labs (all labs ordered are listed, but only abnormal results are displayed) Labs Reviewed - No data to display  ED ECG REPORT   Date: 03/27/2022  Rate: 95  Rhythm: normal sinus rhythm  QRS Axis: normal  Intervals: normal  ST/T Wave abnormalities: normal  Conduction Disutrbances:none  Narrative Interpretation:   Old EKG Reviewed: unchanged from  07/2017  I have personally reviewed the EKG tracing and agree with the computerized printout as noted.   Radiology DG Shoulder Left  Result Date: 03/27/2022 CLINICAL DATA:  Left shoulder pain for 2 days. EXAM: LEFT SHOULDER - 2+ VIEW COMPARISON:  None Available. FINDINGS: Normal bone mineralization. Minimal inferior acromioclavicular joint space narrowing. The glenohumeral joint space is maintained. No acute fracture or dislocation. The visualized portion of the left lung is unremarkable. Nonspecific approximately 7 mm density overlies the posterior lateral proximal upper arm soft tissues. IMPRESSION: 1. Minimal acromioclavicular osteoarthritis. 2. No significant glenohumeral osteoarthritis. Electronically Signed   By: Dale Calderon M.D.   On: 03/27/2022 09:56    Procedures Procedures    Medications Ordered in ED Medications  ketorolac (TORADOL) 15 MG/ML injection 15 mg (has no administration in time range)    ED Course/ Medical Decision Making/ A&P    Patient seen and examined. History obtained directly from patient. Work-up including labs, imaging, EKG ordered in triage, if performed, were reviewed.    Labs/EKG: Independently reviewed and interpreted.  This included: EKG  Imaging: Independently reviewed and interpreted.  This included: X-ray of the shoulder, agree negative  Medications/Fluids: Ordered: IM Toradol   Most recent vital signs reviewed and are as follows: BP (!) 140/103 (BP Location: Left Arm)   Pulse (!) 111   Temp 98.1 F (36.7 C) (Oral)   Resp 18   Ht 6' 1"$  (1.854 m)   Wt 108.9 kg   SpO2 96%   BMI 31.66 kg/m   Initial impression: Left shoulder and upper arm pain  Home treatment plan: RICE protocol, trial of prednisone, continued NSAIDs.  Did discuss appropriate dosage of ibuprofen and not to exceed 600 mg every 6 hours.  Return instructions discussed with patient: Worsening pain, swelling, new symptoms or other concerns  Follow-up instructions discussed  with patient: Sports medicine follow-up in the next 1 week                            Medical Decision Making Amount and/or Complexity of Data Reviewed Radiology: ordered.  Risk Prescription drug management.   Patient presents with shooting left shoulder and upper arm pain.  He has had some numbness over the past several months in his hand and forearm as well.  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  Symptoms are overall suggestive of neuropathic etiology.  No signs of swelling and I have low  concern for DVT.  X-rays negative for fracture or dislocation.  Will trial prednisone taper and have patient continue NSAIDs as well as other conservative measures.  Sports medicine follow-up given and encouraged.  Low concern for PE, ACS, pneumothorax.        Final Clinical Impression(s) / ED Diagnoses Final diagnoses:  Left arm pain  Acute pain of left shoulder    Rx / DC Orders ED Discharge Orders          Ordered    predniSONE (DELTASONE) 20 MG tablet        03/27/22 1131              Carlisle Cater, PA-C 03/27/22 1137    Fredia Sorrow, MD 03/29/22 1418

## 2022-03-28 ENCOUNTER — Ambulatory Visit (HOSPITAL_COMMUNITY): Payer: Self-pay

## 2022-04-06 ENCOUNTER — Other Ambulatory Visit: Payer: Self-pay

## 2022-04-06 ENCOUNTER — Emergency Department (HOSPITAL_COMMUNITY): Payer: Medicaid Other

## 2022-04-06 ENCOUNTER — Emergency Department (HOSPITAL_COMMUNITY)
Admission: EM | Admit: 2022-04-06 | Discharge: 2022-04-06 | Disposition: A | Discharge status: Home / Self Care | Payer: Medicaid Other | Attending: Emergency Medicine | Admitting: Emergency Medicine

## 2022-04-06 DIAGNOSIS — M25512 Pain in left shoulder: Secondary | ICD-10-CM | POA: Diagnosis present

## 2022-04-06 DIAGNOSIS — M79602 Pain in left arm: Secondary | ICD-10-CM | POA: Diagnosis not present

## 2022-04-06 LAB — CBC WITH DIFFERENTIAL/PLATELET
Abs Immature Granulocytes: 0.04 10*3/uL (ref 0.00–0.07)
Basophils Absolute: 0.1 10*3/uL (ref 0.0–0.1)
Basophils Relative: 1 %
Eosinophils Absolute: 0.3 10*3/uL (ref 0.0–0.5)
Eosinophils Relative: 4 %
HCT: 49.6 % (ref 39.0–52.0)
Hemoglobin: 16.9 g/dL (ref 13.0–17.0)
Immature Granulocytes: 1 %
Lymphocytes Relative: 18 %
Lymphs Abs: 1.5 10*3/uL (ref 0.7–4.0)
MCH: 30.7 pg (ref 26.0–34.0)
MCHC: 34.1 g/dL (ref 30.0–36.0)
MCV: 90 fL (ref 80.0–100.0)
Monocytes Absolute: 0.6 10*3/uL (ref 0.1–1.0)
Monocytes Relative: 8 %
Neutro Abs: 5.4 10*3/uL (ref 1.7–7.7)
Neutrophils Relative %: 68 %
Platelets: 221 10*3/uL (ref 150–400)
RBC: 5.51 MIL/uL (ref 4.22–5.81)
RDW: 12.7 % (ref 11.5–15.5)
WBC: 7.9 10*3/uL (ref 4.0–10.5)
nRBC: 0 % (ref 0.0–0.2)

## 2022-04-06 LAB — BASIC METABOLIC PANEL
Anion gap: 8 (ref 5–15)
BUN: 13 mg/dL (ref 6–20)
CO2: 30 mmol/L (ref 22–32)
Calcium: 8.8 mg/dL — ABNORMAL LOW (ref 8.9–10.3)
Chloride: 101 mmol/L (ref 98–111)
Creatinine, Ser: 1.21 mg/dL (ref 0.61–1.24)
GFR, Estimated: >60 mL/min (ref >60)
Glucose, Bld: 86 mg/dL (ref 70–99)
Potassium: 3.6 mmol/L (ref 3.5–5.1)
Sodium: 139 mmol/L (ref 135–145)

## 2022-04-06 LAB — TROPONIN I (HIGH SENSITIVITY): Troponin I (High Sensitivity): 5 ng/L (ref <18)

## 2022-04-06 MED ORDER — LIDOCAINE 5 % EX PTCH: 1.0000 | MEDICATED_PATCH | CUTANEOUS | 0 refills | Status: AC

## 2022-04-06 MED ORDER — CYCLOBENZAPRINE HCL 10 MG PO TABS: 10.0000 mg | ORAL_TABLET | Freq: Two times a day (BID) | ORAL | 0 refills | Status: AC | PRN
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
## 2022-04-06 NOTE — ED Notes (Signed)
Patient verbalizes understanding of discharge instructions. Opportunity for questioning and answers were provided. Pt discharged from ED. 

## 2022-04-06 NOTE — ED Provider Triage Note (Signed)
Emergency Medicine Provider Triage Evaluation Note  JVAUGHN VANTASSELL , a 42 y.o. male  was evaluated in triage.  Pt complains of left shoulder pain. Was just seen for same on 2/19, thought to be MSK, referred to sports med and given prednisone. He did not follow-up because 'the ER has more resources so I figured I would just come back here.' Took prednisone with no relief. Pain has not changed since last visit. States his sister told him it could be a heart issue and presents with concern for same. Denies chest pain or shortness of breath. Pain is worse when he lays on that side and starts in the muscle area of the left side of his neck. No central neck pain.  Review of Systems  Positive:  Negative:   Physical Exam  BP (!) 146/93 (BP Location: Right Arm)   Pulse (!) 105   Temp 97.7 F (36.5 C)   Resp 18   Ht '6\' 1"'$  (1.854 m)   Wt 108.9 kg   SpO2 100%   BMI 31.66 kg/m  Gen:   Awake, no distress   Resp:  Normal effort  MSK:   Moves extremities without difficulty  Other:    Medical Decision Making  Medically screening exam initiated at 10:16 AM.  Appropriate orders placed.  AMAJE IGOU was informed that the remainder of the evaluation will be completed by another provider, this initial triage assessment does not replace that evaluation, and the importance of remaining in the ED until their evaluation is complete.     Bud Face, PA-C 04/06/22 1020

## 2022-04-06 NOTE — ED Triage Notes (Addendum)
Pt. Stated, Im having left shoulder pain for 3 days . Its mostly in the back of my shoulder. I was in [place] for the same and they gave me some prednisone and it did not help at all. They did refer me to another Dr.

## 2022-04-06 NOTE — Discharge Instructions (Signed)
Your history, exam and workup today were overall reassuring.  Your heart enzymes did not show evidence of acute heart injury and your EKG was reassuring on my evaluation.  Your chest x-ray did not show evidence of cardiac abnormality or lung abnormality.  Your labs were otherwise reassuring as we discussed.  We suspect your symptoms are likely musculoskeletal or neuropathic.  As the prednisone did not seem to help with the steroids for nerve pain, please consider using the muscle relaxant and Lidoderm patches to help.  We discussed getting neck imaging however given your lack of any neck pain we agreed to hold on it at this time.  Please follow-up with sports medicine and PCP.  If the symptoms change or worsen acutely, please turn to the nearest emergency room.

## 2022-04-13 ENCOUNTER — Encounter (HOSPITAL_COMMUNITY): Payer: Self-pay

## 2022-04-13 ENCOUNTER — Telehealth (HOSPITAL_COMMUNITY): Payer: Self-pay | Admitting: *Deleted

## 2022-04-13 ENCOUNTER — Ambulatory Visit (INDEPENDENT_AMBULATORY_CARE_PROVIDER_SITE_OTHER): Payer: No Payment, Other

## 2022-04-13 ENCOUNTER — Ambulatory Visit (INDEPENDENT_AMBULATORY_CARE_PROVIDER_SITE_OTHER): Payer: No Payment, Other | Admitting: Psychiatry

## 2022-04-13 VITALS — BP 148/94 | HR 93 | Wt 206.0 lb

## 2022-04-13 DIAGNOSIS — F250 Schizoaffective disorder, bipolar type: Secondary | ICD-10-CM | POA: Diagnosis not present

## 2022-04-13 DIAGNOSIS — F900 Attention-deficit hyperactivity disorder, predominantly inattentive type: Secondary | ICD-10-CM

## 2022-04-13 DIAGNOSIS — F1910 Other psychoactive substance abuse, uncomplicated: Secondary | ICD-10-CM

## 2022-04-13 MED ORDER — ARIPIPRAZOLE ER 400 MG IM PRSY
400.0000 mg | PREFILLED_SYRINGE | Freq: Once | INTRAMUSCULAR | Status: AC
  Administered 2022-04-13: 400 mg via INTRAMUSCULAR

## 2022-04-13 MED ORDER — ABILIFY MAINTENA 400 MG IM SRER: 400.0000 mg | INTRAMUSCULAR | 11 refills | Status: DC

## 2022-04-13 NOTE — Progress Notes (Signed)
BH MD/PA/NP OP Progress Note  04/13/2022 2:29 PM Dale Calderon  MRN:  [id number]  Chief Complaint: "I have been okay"  HPI: 42 year old male seen today for follow up psychiatric evaluation. He has a psychiatric history of substance induced mood disorder, schizoaffective disorder bipolar type, ADHD, and poly substance use. He is currently managed on Abilify Maintena 400 mg monthly. He reports his medications are effective in managing his psychiatric conditions.   Today he is well groomed, pleasant, cooperative, and engaged in conversation. He informed Probation officer that he has been increasingly irritable the last few weeks. He notes that his sister urged him to come in and have his injection. Patient notes that he has also been more distractible and has racing thoughts. Today he denies SI/HI/VAH or paranoia.  Patient notes that he has been having muscle spasms in his left arm. He reports that he recently went to the ED and was prescribed flexeril and and prednisone. Today he quantifies his pain as an 8/10.  Today no medication changes made today. Patient agreeable to continue medications as prescribed.  Visit Diagnosis: No diagnosis found.  Past Psychiatric History: substance induced mood disorder, schizoaffective disorder bipolar type, ADHD, and poly substance use  Past Medical History:  Past Medical History:  Diagnosis Date   ADHD (attention deficit hyperactivity disorder)    No past surgical history on file.  Family Psychiatric History: None reported   Family History: No family history on file.  Social History:  Social History   Socioeconomic History   Marital status: Single    Spouse name: Not on file   Number of children: Not on file   Years of education: Not on file   Highest education level: Not on file  Occupational History   Not on file  Tobacco Use   Smoking status: Former    Types: Cigarettes   Smokeless tobacco: Never  Vaping Use   Vaping Use: Every day   Substances:  Nicotine  Substance and Sexual Activity   Alcohol use: Yes    Comment: occ   Drug use: Yes    Types: Marijuana   Sexual activity: Not on file  Other Topics Concern   Not on file  Social History Narrative   ** Merged History Encounter **       Social Determinants of Health   Financial Resource Strain: Not on file  Food Insecurity: Not on file  Transportation Needs: Not on file  Physical Activity: Not on file  Stress: Not on file  Social Connections: Not on file    Allergies: No Known Allergies  Metabolic Disorder Labs: Lab Results  Component Value Date   HGBA1C 5.5 01/20/2021   No results found for: "PROLACTIN" Lab Results  Component Value Date   CHOL 145 01/20/2021   TRIG 102 01/20/2021   HDL 49 01/20/2021   CHOLHDL 3.0 01/20/2021   LDLCALC 77 01/20/2021   Lab Results  Component Value Date   TSH 7.030 (H) 01/20/2021    Therapeutic Level Labs: No results found for: "LITHIUM" No results found for: "VALPROATE" No results found for: "CBMZ"  Current Medications: Current Outpatient Medications  Medication Sig Dispense Refill   albuterol (PROVENTIL HFA;VENTOLIN HFA) 108 (90 Base) MCG/ACT inhaler Inhale 2 puffs into the lungs every 6 (six) hours as needed for wheezing or shortness of breath.     amphetamine-dextroamphetamine (ADDERALL) 30 MG tablet Take 15 mg by mouth 2 (two) times daily.   0   ARIPiprazole ER (ABILIFY ASIMTUFII)  960 MG/3.2ML PRSY Inject 960 mg into the muscle every 2 (two) months. 3.2 mL 3   ARIPiprazole ER (ABILIFY MAINTENA) 400 MG SRER injection Inject 2 mLs (400 mg total) into the muscle every 28 (twenty-eight) days. Max 1 injection per 28 days dispense brand name only 1 each 11   cyclobenzaprine (FLEXERIL) 10 MG tablet Take 1 tablet (10 mg total) by mouth 2 (two) times daily as needed for muscle spasms. 20 tablet 0   lidocaine (LIDODERM) 5 % Place 1 patch onto the skin daily. Remove & Discard patch within 12 hours or as directed by MD 15 patch 0    predniSONE (DELTASONE) 20 MG tablet 3 Tabs PO Days 1-3, then 2 tabs PO Days 4-6, then 1 tab PO Day 7-9, then Half Tab PO Day 10-12 20 tablet 0   No current facility-administered medications for this visit.     Musculoskeletal: Strength & Muscle Tone: within normal limits Gait & Station: normal Patient leans: N/A  Psychiatric Specialty Exam: Review of Systems  There were no vitals taken for this visit.There is no height or weight on file to calculate BMI.  General Appearance: Well Groomed  Eye Contact:  Good  Speech:  Clear and Coherent and Normal Rate  Volume:  Normal  Mood:  Euthymic  Affect:  Appropriate and Congruent  Thought Process:  Coherent, Goal Directed, and Linear  Orientation:  Full (Time, Place, and Person)  Thought Content: WDL and Logical   Suicidal Thoughts:  No  Homicidal Thoughts:  No  Memory:  Immediate;   Good Recent;   Good Remote;   Good  Judgement:  Good  Insight:  Good  Psychomotor Activity:  Normal  Concentration:  Concentration: Good and Attention Span: Good  Recall:  Good  Fund of Knowledge: Good  Language: Good  Akathisia:  No  Handed:  Right  AIMS (if indicated): done  Assets:  Communication Skills Desire for Improvement Financial Resources/Insurance Housing Leisure Time Social Support  ADL's:  Intact  Cognition: WNL  Sleep:  Good   Screenings: Administrator, Civil Service Office Visit from 04/28/2021 in [facility] Office Visit from 06/01/2020 in [place] Total Score 0 1      GAD-7    [place] Office Visit from 10/13/2021 in [facility] Office Visit from 04/28/2021 in [facility]  Total GAD-7 Score 0 0      PHQ2-9    [place] Office Visit from 10/13/2021 in [facility] Office Visit from 04/28/2021 in [facility] Office Visit from 09/23/2020 in  [facility] Office Visit from 06/01/2020 in [facility]  PHQ-2 Total Score 0 0 3 0  PHQ-9 Total Score -- -- 4 --      Flowsheet Row ED from 04/06/2022 in [facility] Emergency Department at [facility] ED from 03/27/2022 in [facility] Emergency Department at [facility] Visit from 10/13/2021 in [place] No Risk No Risk No Risk        Assessment and Plan: Patient notes that he is doing well on his current medication regimen. No medication changes made today. Patient agreeable to continue medications as prescribed.   Collaboration of Care: Collaboration of Care: Other provider involved in patient's care AEB PCP and shot clinic staff  Patient/Guardian was advised Release  of Information must be obtained prior to any record release in order to collaborate their care with an outside provider. Patient/Guardian was advised if they have not already done so to contact the registration department to sign all necessary forms in order for [place] to release information regarding their care.   Consent: Patient/Guardian gives verbal consent for treatment and assignment of benefits for services provided during this visit. Patient/Guardian expressed understanding and agreed to proceed.    Salley Slaughter, NP 04/13/2022, 2:29 PM

## 2022-04-13 NOTE — Progress Notes (Signed)
  PATIENT ARRIVED FOR INJECTION -- BY DFB MA    ARIPiprazole ER (ABILIFY MAINTENA) 400 MG  TOLERATED WELL IN RIGHT GLUTEAL NO HI/SI NOR  AH/VH STATED HE'S DOING WELL

## 2022-04-13 NOTE — Telephone Encounter (Signed)
Called to dine, spoke with his sister, he hasn't been seen since Dec. Sister said he needs to come in, she states she can tell, but since I only work PRN and dont call when he no shows they havent been reminded. He will come in today for his shot and an assessment

## 2022-05-11 ENCOUNTER — Ambulatory Visit (HOSPITAL_COMMUNITY): Payer: No Payment, Other

## 2022-05-23 ENCOUNTER — Encounter (HOSPITAL_COMMUNITY): Payer: Self-pay

## 2022-05-23 ENCOUNTER — Ambulatory Visit (INDEPENDENT_AMBULATORY_CARE_PROVIDER_SITE_OTHER): Payer: No Payment, Other | Admitting: Psychiatry

## 2022-05-23 DIAGNOSIS — F250 Schizoaffective disorder, bipolar type: Secondary | ICD-10-CM | POA: Diagnosis not present

## 2022-05-23 MED ORDER — ABILIFY MAINTENA 400 MG IM SRER: 400.0000 mg | INTRAMUSCULAR | 11 refills | Status: DC

## 2022-05-23 NOTE — Progress Notes (Addendum)
BH MD/PA/NP OP Progress Note  05/23/2022 2:43 PM Dale Calderon  MRN:  [id number]  Chief Complaint: "I missed my last injection"  HPI: 42 year old male seen today for follow up psychiatric evaluation. He has a psychiatric history of substance induced mood disorder, schizoaffective disorder bipolar type, ADHD, and poly substance use. He is currently managed on Abilify Maintena 400 mg monthly and Adderall (receives from his PCP). He reports his medications are effective in managing his psychiatric conditions.   Today he is well groomed, pleasant, cooperative, and engaged in conversation. He informed Clinical research associate that he missed his last injection on 05/11/2022 and request to have it today. Nursing staff unavailable at this time but provider was agreeable to administering patients Abilify Maintena IM injection (LOT [id number] and Expiration date 03/2024).  Patient tolerated injection well in left buttock.  He notes that his mood is stable and reports that he has minimal anxiety and depression. Today he denies SI/HI/VAH or paranoia.  Today no medication changes made today. Patient agreeable to continue medications as prescribed.  Visit Diagnosis:    ICD-10-CM   1. Schizoaffective disorder, bipolar type  F25.0 ARIPiprazole ER (ABILIFY MAINTENA) 400 MG SRER injection      Past Psychiatric History: substance induced mood disorder, schizoaffective disorder bipolar type, ADHD, and poly substance use  Past Medical History:  Past Medical History:  Diagnosis Date   ADHD (attention deficit hyperactivity disorder)    No past surgical history on file.  Family Psychiatric History: None reported   Family History: No family history on file.  Social History:  Social History   Socioeconomic History   Marital status: Single    Spouse name: Not on file   Number of children: Not on file   Years of education: Not on file   Highest education level: Not on file  Occupational History   Not on file  Tobacco Use    Smoking status: Former    Types: Cigarettes   Smokeless tobacco: Never  Vaping Use   Vaping Use: Every day   Substances: Nicotine  Substance and Sexual Activity   Alcohol use: Yes    Comment: occ   Drug use: Yes    Types: Marijuana   Sexual activity: Not on file  Other Topics Concern   Not on file  Social History Narrative   ** Merged History Encounter **       Social Determinants of Health   Financial Resource Strain: Not on file  Food Insecurity: Not on file  Transportation Needs: Not on file  Physical Activity: Not on file  Stress: Not on file  Social Connections: Not on file    Allergies: No Known Allergies  Metabolic Disorder Labs: Lab Results  Component Value Date   HGBA1C 5.5 01/20/2021   No results found for: "PROLACTIN" Lab Results  Component Value Date   CHOL 145 01/20/2021   TRIG 102 01/20/2021   HDL 49 01/20/2021   CHOLHDL 3.0 01/20/2021   LDLCALC 77 01/20/2021   Lab Results  Component Value Date   TSH 7.030 (H) 01/20/2021    Therapeutic Level Labs: No results found for: "LITHIUM" No results found for: "VALPROATE" No results found for: "CBMZ"  Current Medications: Current Outpatient Medications  Medication Sig Dispense Refill   albuterol (PROVENTIL HFA;VENTOLIN HFA) 108 (90 Base) MCG/ACT inhaler Inhale 2 puffs into the lungs every 6 (six) hours as needed for wheezing or shortness of breath.     amphetamine-dextroamphetamine (ADDERALL) 30 MG tablet Take 15  mg by mouth 2 (two) times daily.   0   ARIPiprazole ER (ABILIFY MAINTENA) 400 MG SRER injection Inject 2 mLs (400 mg total) into the muscle every 28 (twenty-eight) days. Max 1 injection per 28 days dispense brand name only 1 each 11   cyclobenzaprine (FLEXERIL) 10 MG tablet Take 1 tablet (10 mg total) by mouth 2 (two) times daily as needed for muscle spasms. 20 tablet 0   lidocaine (LIDODERM) 5 % Place 1 patch onto the skin daily. Remove & Discard patch within 12 hours or as directed by MD 15  patch 0   predniSONE (DELTASONE) 20 MG tablet 3 Tabs PO Days 1-3, then 2 tabs PO Days 4-6, then 1 tab PO Day 7-9, then Half Tab PO Day 10-12 20 tablet 0   No current facility-administered medications for this visit.     Musculoskeletal: Strength & Muscle Tone: within normal limits Gait & Station: normal Patient leans: N/A  Psychiatric Specialty Exam: Review of Systems  There were no vitals taken for this visit.There is no height or weight on file to calculate BMI.  General Appearance: Well Groomed  Eye Contact:  Good  Speech:  Clear and Coherent and Normal Rate  Volume:  Normal  Mood:  Euthymic  Affect:  Appropriate and Congruent  Thought Process:  Coherent, Goal Directed, and Linear  Orientation:  Full (Time, Place, and Person)  Thought Content: WDL and Logical   Suicidal Thoughts:  No  Homicidal Thoughts:  No  Memory:  Immediate;   Good Recent;   Good Remote;   Good  Judgement:  Good  Insight:  Good  Psychomotor Activity:  Normal  Concentration:  Concentration: Good and Attention Span: Good  Recall:  Good  Fund of Knowledge: Good  Language: Good  Akathisia:  No  Handed:  Right  AIMS (if indicated): done  Assets:  Communication Skills Desire for Improvement Financial Resources/Insurance Housing Leisure Time Social Support  ADL's:  Intact  Cognition: WNL  Sleep:  Good   Screenings: Geneticist, molecular Office Visit from 04/28/2021 in [facility] Office Visit from 06/01/2020 in [facility]  AIMS Total Score 0 1      GAD-7    Flowsheet Row Office Visit from 04/13/2022 in [facility] Office Visit from 10/13/2021 in [facility] Office Visit from 04/28/2021 in [facility]  Total GAD-7 Score 9 0 0      PHQ2-9    Flowsheet Row Office Visit from 04/13/2022 in [facility] Office Visit from  10/13/2021 in [facility] Office Visit from 04/28/2021 in [facility] Office Visit from 09/23/2020 in [facility] Office Visit from 06/01/2020 in [facility]  PHQ-2 Total Score 3 0 0 3 0  PHQ-9 Total Score 6 -- -- 4 --      Flowsheet Row ED from 04/06/2022 in [facility] Emergency Department at [facility] ED from 03/27/2022 in [facility] Emergency Department at [facility] Office Visit from 10/13/2021 in [facility]  C-SSRS RISK CATEGORY No Risk No Risk No Risk        Assessment and Plan: Patient notes that he is doing well on his current medication regimen. No medication changes made today. Patient agreeable to continue medications as prescribed.  Provider agreeable to give patient Abilify Maintena  injection (LOT [id number] and Expiration date 03/2024)today.  Patient tolerated injection well injection well in left buttock.  1. Schizoaffective disorder, bipolar type  Continue- ARIPiprazole ER (ABILIFY MAINTENA) 400 MG SRER injection; Inject 2 mLs (400 mg total) into the muscle every 28 (twenty-eight) days. Max 1 injection per 28 days dispense brand name only  Dispense: 1 each; Refill: 11   Collaboration of Care: Collaboration of Care: Other provider involved in patient's care AEB PCP and shot clinic staff  Patient/Guardian was advised Release of Information must be obtained prior to any record release in order to collaborate their care with an outside provider. Patient/Guardian was advised if they have not already done so to contact the registration department to sign all necessary forms in order for [place] to release information regarding their care.   Consent: Patient/Guardian gives verbal consent for treatment and assignment of benefits for services provided during this visit. Patient/Guardian expressed understanding and agreed to proceed.     Shanna Cisco, NP 05/23/2022, 2:43 PM

## 2022-06-01 ENCOUNTER — Telehealth (HOSPITAL_COMMUNITY): Payer: Self-pay | Admitting: *Deleted

## 2022-06-01 NOTE — Telephone Encounter (Signed)
Call from patients sister stating he is in some type of cycle where he is confused and states its not time for his shot. He is three weeks late for his shot. She is to bring him in today by 4 he will get it today.

## 2022-06-01 NOTE — Telephone Encounter (Signed)
Neils sister called back saying he did in fact have his shot on the 41 th of April, writer missed it when I spoke with sister a bit earlier because the shot was given that day by Dr Doyne Keel and I was looking for nursing staff in the hx. He is not in fact then due for his shot but sister says he is not doing well and she doesn't believe she can get him in the car to come here to speak with the provider to discuss possible med changes and or hospitalization as sister states he made a vague suicidal reference and is confused and not at baseline. This Clinical research associate has not seen him display this behavior previous but he has been inconsistant recently with his inj of Abilify M. I called sister back to offer the option of calling mobile crisis to come out to him. She can make the referral call and provided her with mobile crisises number but he has to agree to speak with them when they come out. Sister expressed her understanding and will call if needed, otherwise told about emergency services here 24/7 but has to come in or coming in any day to clinic to see the provider.

## 2022-06-05 ENCOUNTER — Ambulatory Visit (HOSPITAL_COMMUNITY): Payer: Medicaid Other

## 2022-06-05 ENCOUNTER — Encounter (HOSPITAL_COMMUNITY): Payer: Self-pay

## 2022-06-05 ENCOUNTER — Encounter (HOSPITAL_COMMUNITY): Payer: Self-pay | Admitting: Psychiatry

## 2022-06-05 ENCOUNTER — Ambulatory Visit (INDEPENDENT_AMBULATORY_CARE_PROVIDER_SITE_OTHER): Payer: Medicaid Other | Admitting: Psychiatry

## 2022-06-05 VITALS — BP 137/95 | HR 91 | Ht 73.0 in | Wt 233.5 lb

## 2022-06-05 DIAGNOSIS — F411 Generalized anxiety disorder: Secondary | ICD-10-CM | POA: Insufficient documentation

## 2022-06-05 DIAGNOSIS — F250 Schizoaffective disorder, bipolar type: Secondary | ICD-10-CM | POA: Diagnosis not present

## 2022-06-05 DIAGNOSIS — G4700 Insomnia, unspecified: Secondary | ICD-10-CM | POA: Insufficient documentation

## 2022-06-05 DIAGNOSIS — F200 Paranoid schizophrenia: Secondary | ICD-10-CM | POA: Insufficient documentation

## 2022-06-05 MED ORDER — TRAZODONE HCL 100 MG PO TABS: 100.0000 mg | ORAL_TABLET | Freq: Every day | ORAL | 3 refills | Status: DC

## 2022-06-05 MED ORDER — HALOPERIDOL 2 MG PO TABS: 2.0000 mg | ORAL_TABLET | Freq: Every evening | ORAL | 3 refills | Status: DC

## 2022-06-05 NOTE — Progress Notes (Signed)
BH MD/PA/NP OP Progress Note  06/05/2022 3:45 PM Dale Calderon  MRN:  [id number]  Chief Complaint: "I am covered by God. I am God" Per Father  "He has been acting strange"  HPI: 42 year old male seen today for follow up psychiatric evaluation. He has a psychiatric history of substance induced mood disorder, schizoaffective disorder bipolar type, ADHD, and poly substance use. He is currently managed on Abilify Maintena 400 mg monthly and Adderall (receives from his PCP). He reports his medications are somewhat effective in managing his psychiatric conditions.   Today he was hyperverbal, grandiose, and delusional.  He informed Clinical research associate that he is covered by God.  He notes that he is God.  He then went on to say that he is the opposite of God and reports that he is a dog.  Patient hyperreligious today.  He placed a rock on provider's desk and said that this is his firm foundation.  Patient was seen with his father who notes that he has been acting strangely.  He informed Clinical research associate that he believes that he is technology and that he is the Messiah.  He reports that the patient stands on a rock outside of his home and talks to himself.  He also notes that he has been trying to convince people that he is the Messiah.  Patient informed writer that he may be connected with Wi-Fi and states "I can feel everybody". He then informed writer that when people are saved God stings him on his arm.  He notes that people will believe in him and "come under agreement with Lloyd Huger".  Patient reports at times he has racing thoughts.   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  Provider conducted a GAD-7 and patient scored a 5.  Provider also conducted PHQ-9 patient scored an 11.  He endorses reduced appetite and notes that he is lost 10 pounds in the last month.  Today he denies SI/HI.  Recently patient notes that he has been seeing his deceased mother who he reports  is now a bird.  Patient and father informed Clinical research associate that he lives in an apartment near his family owned [employer].  He informed Clinical research associate that his son has been keeping up his neighbors as he has not been sleeping.  Patient informed writer that he slept 12 hours in the last 3 days.  He then went on to say that he sleeps over 8 hours nightly.  Patient's father confirms that patient has not been sleeping well.  Patient informed Clinical research associate that he has not used marijuana in a few days.  He also informed Clinical research associate that he has not used methamphetamines since last Sunday.  Today patient is agreeable to starting trazodone 100 mg nightly to help manage sleep.  He is also agreeable to starting Haldol 2 mg nightly to help manage his symptoms of psychosis. He will continue Abilify Maintena 400 mg monthly as prescribed.  Provider ordered routine labs (CBC, lipid panel, thyroid levels, HgbA1c, CMP, and UDS). Potential side effects of medication and risks vs benefits of treatment vs non-treatment were explained and discussed. All questions were answered.    Visit Diagnosis:    ICD-10-CM   1. Schizoaffective disorder, bipolar type (HCC)  F25.0 traZODone (DESYREL) 100 MG tablet    haloperidol (HALDOL) 2 MG tablet    CBC w/Diff/Platelet    Comprehensive Metabolic Panel (CMET)    Hepatic function panel  Lipid Profile    Rapid urine drug screen (hospital performed)    HgB A1c    2. Generalized anxiety disorder  F41.1 traZODone (DESYREL) 100 MG tablet    3. Insomnia, unspecified type  G47.00 traZODone (DESYREL) 100 MG tablet      Past Psychiatric History: substance induced mood disorder, schizoaffective disorder bipolar type, ADHD, and poly substance use  Past Medical History:  Past Medical History:  Diagnosis Date   ADHD (attention deficit hyperactivity disorder)    History reviewed. No pertinent surgical history.  Family Psychiatric History: None reported   Family History: History reviewed. No pertinent  family history.  Social History:  Social History   Socioeconomic History   Marital status: Single    Spouse name: Not on file   Number of children: Not on file   Years of education: Not on file   Highest education level: Not on file  Occupational History   Not on file  Tobacco Use   Smoking status: Former    Types: Cigarettes   Smokeless tobacco: Never  Vaping Use   Vaping Use: Every day   Substances: Nicotine  Substance and Sexual Activity   Alcohol use: Yes    Comment: occ   Drug use: Yes    Types: Marijuana   Sexual activity: Not on file  Other Topics Concern   Not on file  Social History Narrative   ** Merged History Encounter **       Social Determinants of Health   Financial Resource Strain: Not on file  Food Insecurity: Not on file  Transportation Needs: Not on file  Physical Activity: Not on file  Stress: Not on file  Social Connections: Not on file    Allergies: No Known Allergies  Metabolic Disorder Labs: Lab Results  Component Value Date   HGBA1C 5.5 01/20/2021   No results found for: "PROLACTIN" Lab Results  Component Value Date   CHOL 145 01/20/2021   TRIG 102 01/20/2021   HDL 49 01/20/2021   CHOLHDL 3.0 01/20/2021   LDLCALC 77 01/20/2021   Lab Results  Component Value Date   TSH 7.030 (H) 01/20/2021    Therapeutic Level Labs: No results found for: "LITHIUM" No results found for: "VALPROATE" No results found for: "CBMZ"  Current Medications: Current Outpatient Medications  Medication Sig Dispense Refill   haloperidol (HALDOL) 2 MG tablet Take 1 tablet (2 mg total) by mouth at bedtime. 30 tablet 3   traZODone (DESYREL) 100 MG tablet Take 1 tablet (100 mg total) by mouth at bedtime. 30 tablet 3   albuterol (PROVENTIL HFA;VENTOLIN HFA) 108 (90 Base) MCG/ACT inhaler Inhale 2 puffs into the lungs every 6 (six) hours as needed for wheezing or shortness of breath.     amphetamine-dextroamphetamine (ADDERALL) 30 MG tablet Take 15 mg by  mouth 2 (two) times daily.   0   ARIPiprazole ER (ABILIFY MAINTENA) 400 MG SRER injection Inject 2 mLs (400 mg total) into the muscle every 28 (twenty-eight) days. Max 1 injection per 28 days dispense brand name only 1 each 11   cyclobenzaprine (FLEXERIL) 10 MG tablet Take 1 tablet (10 mg total) by mouth 2 (two) times daily as needed for muscle spasms. 20 tablet 0   lidocaine (LIDODERM) 5 % Place 1 patch onto the skin daily. Remove & Discard patch within 12 hours or as directed by MD 15 patch 0   predniSONE (DELTASONE) 20 MG tablet 3 Tabs PO Days 1-3, then 2 tabs PO Days 4-6,  then 1 tab PO Day 7-9, then Half Tab PO Day 10-12 20 tablet 0   No current facility-administered medications for this visit.     Musculoskeletal: Strength & Muscle Tone: within normal limits Gait & Station: normal Patient leans: N/A  Psychiatric Specialty Exam: Review of Systems  Blood pressure (!) 137/95, pulse 91, height 6\' 1"  (1.854 m), weight 233 lb 8 oz (105.9 kg), SpO2 100 %.Body mass index is 30.81 kg/m.  General Appearance: Well Groomed  Eye Contact:  Good  Speech:  Clear and Coherent and Normal Rate  Volume:  Normal  Mood:  Euphoric  Affect:  Full Range  Thought Process:  Disorganized  Orientation:  Full (Time, Place, and Person)  Thought Content: Delusions, Hallucinations: Auditory Visual, and Ideas of Reference:   Delusions   Suicidal Thoughts:  No  Homicidal Thoughts:  No  Memory:  Immediate;   Fair Recent;   Fair Remote;   Fair  Judgement:  Fair  Insight:  Fair  Psychomotor Activity:  Normal  Concentration:  Concentration: Fair and Attention Span: Fair  Recall:  Good  Fund of Knowledge: Good  Language: Good  Akathisia:  No  Handed:  Right  AIMS (if indicated): done, 0  Assets:  Communication Skills Desire for Improvement Financial Resources/Insurance Housing Leisure Time Social Support  ADL's:  Intact  Cognition: WNL  Sleep:  Poor   Screenings: AIMS    Flowsheet Row Clinical  Support from 06/05/2022 in [facility] Office Visit from 04/28/2021 in [facility] Office Visit from 06/01/2020 in [facility]  AIMS Total Score 0 0 1      GAD-7    Flowsheet Row Clinical Support from 06/05/2022 in [facility] Office Visit from 04/13/2022 in [facility] Office Visit from 10/13/2021 in [facility] Office Visit from 04/28/2021 in [facility]  Total GAD-7 Score 5 9 0 0      PHQ2-9    Flowsheet Row Clinical Support from 06/05/2022 in [facility] Office Visit from 04/13/2022 in [facility] Office Visit from 10/13/2021 in [facility] Office Visit from 04/28/2021 in [facility] Office Visit from 09/23/2020 in [facility]  PHQ-2 Total Score 0 3 0 0 3  PHQ-9 Total Score 11 6 -- -- 4      Flowsheet Row Clinical Support from 06/05/2022 in [facility] ED from 04/06/2022 in [facility] Emergency Department at [facility] ED from 03/27/2022 in [facility] Emergency Department at [facility]  C-SSRS RISK CATEGORY No Risk No Risk No Risk        Assessment and Plan: Patient endorses paranoia, psychosis, VAH, and insomnia. Today patient is agreeable to starting trazodone 100 mg nightly to help manage sleep.  He is also agreeable to starting Haldol 2 mg nightly to help manage his symptoms of psychosis.  He will continue Abilify Maintena 400 mg monthly as prescribed.  Provider ordered routine labs (CBC, lipid panel, thyroid levels, HgbA1c, CMP, and UDS  1. Generalized anxiety disorder  Start- traZODone (DESYREL) 100 MG tablet; Take 1 tablet (100 mg total) by mouth at bedtime.  Dispense: 30 tablet; Refill: 3  2.  Insomnia, unspecified type  Start- traZODone (DESYREL) 100 MG tablet; Take 1 tablet (100 mg total) by mouth at bedtime.  Dispense: 30  tablet; Refill: 3  3. Schizoaffective disorder, bipolar type (HCC)  Start- traZODone (DESYREL) 100 MG tablet; Take 1 tablet (100 mg total) by mouth at bedtime.  Dispense: 30 tablet; Refill: 3 Start- haloperidol (HALDOL) 2 MG tablet; Take 1 tablet (2 mg total) by mouth at bedtime.  Dispense: 30 tablet; Refill: 3 - CBC w/Diff/Platelet - Comprehensive Metabolic Panel (CMET) - Hepatic function panel - Lipid Profile - Rapid urine drug screen (hospital performed) - HgB A1c     Collaboration of Care: Collaboration of Care: Other provider involved in patient's care AEB PCP and shot clinic staff  Patient/Guardian was advised Release of Information must be obtained prior to any record release in order to collaborate their care with an outside provider. Patient/Guardian was advised if they have not already done so to contact the registration department to sign all necessary forms in order for [place] to release information regarding their care.   Consent: Patient/Guardian gives verbal consent for treatment and assignment of benefits for services provided during this visit. Patient/Guardian expressed understanding and agreed to proceed.   Follow-up in 1 month Follow-up with shot clinic staff Shanna Cisco, NP 06/05/2022, 3:45 PM

## 2022-06-07 ENCOUNTER — Telehealth (HOSPITAL_COMMUNITY): Payer: Self-pay | Admitting: Psychiatry

## 2022-06-07 NOTE — Telephone Encounter (Signed)
Medication management - Telephone call from patient's sister, Rhunette Croft expressing concerns patient is psychotic at this time, has not improved with recent medication adjustments, and she is concerned for his safety and the people around him. Collateral stated she had seen him this morning and that patient "thinks he is Jesus".  Stated they had to redirect him on 06/06/22 not to walk out into traffic where they live and questioned what was best to do considering his current state.  Informed collateral this nurse could not confirm or deny this person is know to our agency but in general terms explained to her if she felt patient was in immediate danger, she should go and do a petition at [employer] office so he is brought in for an emergency evaluation.  Collateral stated she has brought patient in several times only for him to be evaluated and released, stating he could hold things together at times but does feel things are currently escalating.  Collateral stated she was going to try to bring him in again on her own but if he would not come she would go and do a petition so patient would be brought in for an evaluation.  Informed with the reported circumstances, if collateral felt patient was a danger, our provider could not assist with a current petition if the person was not being directly evaluated by them and also would most likely be reluctant to keep adjusting medications due to the potential reactions and this probably needing to be done in a safe and inpatient setting.  Collateral ended call with plans to attempt to bring pt in or to do a petition.  [facility] staff notified to be aware patient may be brought in and of concerns expressed by patient's sister.  Also informed of case discussed with Dr. Doyne Keel and her recommendation was for probable hospitalization as well if patient presented as reported by sister.

## 2022-06-20 ENCOUNTER — Ambulatory Visit (HOSPITAL_COMMUNITY): Payer: No Payment, Other

## 2022-06-28 ENCOUNTER — Encounter (HOSPITAL_COMMUNITY): Payer: Self-pay

## 2022-06-28 ENCOUNTER — Ambulatory Visit (INDEPENDENT_AMBULATORY_CARE_PROVIDER_SITE_OTHER): Payer: Medicaid Other | Admitting: Student in an Organized Health Care Education/Training Program

## 2022-06-28 ENCOUNTER — Ambulatory Visit (INDEPENDENT_AMBULATORY_CARE_PROVIDER_SITE_OTHER): Payer: Medicaid Other

## 2022-06-28 VITALS — BP 137/100 | HR 137 | Ht 73.0 in | Wt 234.0 lb

## 2022-06-28 VITALS — BP 137/100 | HR 137 | Ht 73.0 in | Wt 234.6 lb

## 2022-06-28 DIAGNOSIS — F411 Generalized anxiety disorder: Secondary | ICD-10-CM

## 2022-06-28 DIAGNOSIS — F250 Schizoaffective disorder, bipolar type: Secondary | ICD-10-CM

## 2022-06-28 DIAGNOSIS — F200 Paranoid schizophrenia: Secondary | ICD-10-CM

## 2022-06-28 DIAGNOSIS — G4700 Insomnia, unspecified: Secondary | ICD-10-CM

## 2022-06-28 MED ORDER — ARIPIPRAZOLE ER 400 MG IM PRSY
400.0000 mg | PREFILLED_SYRINGE | Freq: Once | INTRAMUSCULAR | Status: AC
  Administered 2022-06-28: 400 mg via INTRAMUSCULAR

## 2022-06-28 NOTE — Progress Notes (Signed)
  PATIENT ARRIVED FOR INJECTION -- BY [facility] MA    ARIPiprazole ER (ABILIFY MAINTENA) 400 MG  TOLERATED WELL IN RIGHT GLUTEAL NO HI/SI NOR  AH/VH STATED HE'S DOING WELL   

## 2022-06-28 NOTE — Progress Notes (Signed)
Orthostatic BP done. Sitting 130/104 pulse 127 and standing 123/104 pulse 132. Education given to see urgent care for an EKG and call PCP for further treatment of hypertension and increased heart rate.

## 2022-06-28 NOTE — Progress Notes (Signed)
BH MD/PA/NP OP Progress Note  06/28/2022 2:23 PM Dale Calderon  MRN:  [id number]  Chief Complaint:  Chief Complaint  Patient presents with   LAI    HPI:  Dale Calderon. Cl[place] is a 42 year old male seen today for LAI clinic.  PPHx is significant for substance induced mood disorder, schizoaffective disorder bipolar type, ADHD, and poly substance use. He is currently managed on Abilify Maintena 400 mg monthly and Adderall (receives from his PCP).   Discussed with him that his heart rate was significantly elevated.  He reports that he feels fine and denies headache, vision changes, nausea, jaw pain, left arm pain, or chest pain.  He reports the only physical symptom he has today is some left shoulder pain which he has had for a few months and is unchanged.  He reports that most likely this is just because he took his Adderall this morning for the first time in about 3 weeks.  Discussed with him the EKG done in March did not show any issues and that given the significance of his symptoms last time it would be best to give his Abilify Maintena injection today.  Discussed with him that our recommendation would be after getting the injection to go to an urgent care to get an updated EKG and to call his PCP to make an appointment to follow up on this.  He reported understanding and is agreeable with this.  He reports no SI, HI, or AVH.  He reports sleep is good.  He reports appetite is doing good.  He reports no concerns present.   Visit Diagnosis:    ICD-10-CM   1. Schizoaffective disorder, bipolar type (HCC)  F25.0     2. Generalized anxiety disorder  F41.1     3. Insomnia, unspecified type  G47.00       Past Psychiatric History: substance induced mood disorder, schizoaffective disorder bipolar type, ADHD, and poly substance use.  He is currently managed on Abilify Maintena 400 mg monthly and Adderall (receives from his PCP).  Past Medical History:  Past Medical History:  Diagnosis Date   ADHD  (attention deficit hyperactivity disorder)    No past surgical history on file.  Family Psychiatric History: None reported   Family History: No family history on file.  Social History:  Social History   Socioeconomic History   Marital status: Single    Spouse name: Not on file   Number of children: Not on file   Years of education: Not on file   Highest education level: Not on file  Occupational History   Not on file  Tobacco Use   Smoking status: Former    Types: Cigarettes   Smokeless tobacco: Never  Vaping Use   Vaping Use: Every day   Substances: Nicotine  Substance and Sexual Activity   Alcohol use: Yes    Comment: occ   Drug use: Yes    Types: Marijuana   Sexual activity: Not on file  Other Topics Concern   Not on file  Social History Narrative   ** Merged History Encounter **       Social Determinants of Health   Financial Resource Strain: Not on file  Food Insecurity: Not on file  Transportation Needs: Not on file  Physical Activity: Not on file  Stress: Not on file  Social Connections: Not on file    Allergies: No Known Allergies  Metabolic Disorder Labs: Lab Results  Component Value Date   HGBA1C 5.5  01/20/2021   No results found for: "PROLACTIN" Lab Results  Component Value Date   CHOL 145 01/20/2021   TRIG 102 01/20/2021   HDL 49 01/20/2021   CHOLHDL 3.0 01/20/2021   LDLCALC 77 01/20/2021   Lab Results  Component Value Date   TSH 7.030 (H) 01/20/2021    Therapeutic Level Labs: No results found for: "LITHIUM" No results found for: "VALPROATE" No results found for: "CBMZ"  Current Medications: Current Outpatient Medications  Medication Sig Dispense Refill   albuterol (PROVENTIL HFA;VENTOLIN HFA) 108 (90 Base) MCG/ACT inhaler Inhale 2 puffs into the lungs every 6 (six) hours as needed for wheezing or shortness of breath.     amphetamine-dextroamphetamine (ADDERALL) 30 MG tablet Take 15 mg by mouth 2 (two) times daily.   0    ARIPiprazole ER (ABILIFY MAINTENA) 400 MG SRER injection Inject 2 mLs (400 mg total) into the muscle every 28 (twenty-eight) days. Max 1 injection per 28 days dispense brand name only 1 each 11   cyclobenzaprine (FLEXERIL) 10 MG tablet Take 1 tablet (10 mg total) by mouth 2 (two) times daily as needed for muscle spasms. 20 tablet 0   haloperidol (HALDOL) 2 MG tablet Take 1 tablet (2 mg total) by mouth at bedtime. 30 tablet 3   lidocaine (LIDODERM) 5 % Place 1 patch onto the skin daily. Remove & Discard patch within 12 hours or as directed by MD 15 patch 0   predniSONE (DELTASONE) 20 MG tablet 3 Tabs PO Days 1-3, then 2 tabs PO Days 4-6, then 1 tab PO Day 7-9, then Half Tab PO Day 10-12 20 tablet 0   traZODone (DESYREL) 100 MG tablet Take 1 tablet (100 mg total) by mouth at bedtime. 30 tablet 3   No current facility-administered medications for this visit.     Musculoskeletal: Strength & Muscle Tone: within normal limits Gait & Station: normal Patient leans: N/A  Psychiatric Specialty Exam: Review of Systems  Respiratory:  Negative for cough and shortness of breath.   Cardiovascular:  Negative for chest pain.  Gastrointestinal:  Negative for abdominal pain, constipation, diarrhea, nausea and vomiting.  Neurological:  Negative for weakness and headaches.  Psychiatric/Behavioral:  Negative for dysphoric mood, hallucinations, sleep disturbance and suicidal ideas. The patient is not nervous/anxious.     Blood pressure (!) 137/100, pulse (!) 137, height 6\' 1"  (1.854 m), weight 234 lb (106.1 kg), SpO2 100 %.Body mass index is 30.87 kg/m.  General Appearance: Casual and Fairly Groomed  Eye Contact:  Fair  Speech:  Clear and Coherent and Normal Rate  Volume:  Normal  Mood:   "ok"  Affect:  Appropriate and Congruent  Thought Process:  Goal Directed  Orientation:  Full (Time, Place, and Person)  Thought Content: WDL and Logical   Suicidal Thoughts:  No  Homicidal Thoughts:  No  Memory:   Immediate;   Good Recent;   Good  Judgement:  Good  Insight:  Good  Psychomotor Activity:  Normal  Concentration:  Concentration: Good and Attention Span: Good  Recall:  Good  Fund of Knowledge: Good  Language: Good  Akathisia:  Negative  Handed:  Right  AIMS (if indicated): not done  Assets:  Communication Skills Desire for Improvement Financial Resources/Insurance Housing Social Support  ADL's:  Intact  Cognition: WNL  Sleep:  Good   Screenings: AIMS    Flowsheet Row Clinical Support from 06/05/2022 in [facility] Office Visit from 04/28/2021 in [facility]  Office Visit from 06/01/2020 in [facility]  AIMS Total Score 0 0 1      GAD-7    Flowsheet Row Clinical Support from 06/05/2022 in [facility] Office Visit from 04/13/2022 in [facility] Office Visit from 10/13/2021 in [facility] Office Visit from 04/28/2021 in [facility]  Total GAD-7 Score 5 9 0 0      PHQ2-9    Flowsheet Row Clinical Support from 06/05/2022 in [facility] Office Visit from 04/13/2022 in [facility] Office Visit from 10/13/2021 in [facility] Office Visit from 04/28/2021 in [facility] Office Visit from 09/23/2020 in [facility]  PHQ-2 Total Score 0 3 0 0 3  PHQ-9 Total Score 11 6 -- -- 4      Flowsheet Row Clinical Support from 06/05/2022 in [facility] ED from 04/06/2022 in [facility] Emergency Department at [facility] ED from 03/27/2022 in [facility] Emergency Department at [facility]  C-SSRS RISK CATEGORY No Risk No Risk No Risk        Assessment and Plan:  Adham A. Hoiland is a 42 year old male seen today for LAI  clinic.  PPHx is significant for substance induced mood disorder, schizoaffective disorder bipolar type, ADHD, and poly substance use. He is currently managed on Abilify Maintena 400 mg monthly and Adderall (receives from his PCP).    Devvin has tachycardia with sustained HR of ~130.  He denies chest pain, nausea, jaw pain, or any other physical issue.  Given the symptoms at his last appointment the benefits outweigh the risks of giving him his injection today and so will proceed.  Discussed with him our concern for his elevated HR and that he go to an Urgent Care to get an EKG and follow up with his PCP.  He reported understanding and that he would do this.  Obtained orthostatic readings and there was no orthostasis present.  He will return for his next injection in approximately 28 days.   Schizoaffective disorder, bipolar type (HCC): -Continue haloperidol (HALDOL) 2 MG tablet; Take 1 tablet (2 mg total) by mouth at bedtime.  No refills sent at this time. -Continue Abilify Maintena 400 mg IM q28.  No refills sent at this time.   GAD  Insomnia: -Continue traZODone (DESYREL) 100 MG tablet; Take 1 tablet (100 mg total) by mouth at bedtime.  No refills sent at this time.     Collaboration of Care:   Patient/Guardian was advised Release of Information must be obtained prior to any record release in order to collaborate their care with an outside provider. Patient/Guardian was advised if they have not already done so to contact the registration department to sign all necessary forms in order for [place] to release information regarding their care.   Consent: Patient/Guardian gives verbal consent for treatment and assignment of benefits for services provided during this visit. Patient/Guardian expressed understanding and agreed to proceed.    Lauro Franklin, MD 06/28/2022, 2:23 PM

## 2022-07-06 ENCOUNTER — Encounter (HOSPITAL_COMMUNITY): Payer: No Payment, Other | Admitting: Psychiatry

## 2022-07-06 ENCOUNTER — Ambulatory Visit (HOSPITAL_COMMUNITY): Payer: No Payment, Other

## 2022-07-24 ENCOUNTER — Other Ambulatory Visit (HOSPITAL_COMMUNITY): Payer: Self-pay

## 2022-07-26 ENCOUNTER — Other Ambulatory Visit (HOSPITAL_COMMUNITY): Payer: Self-pay | Admitting: Physician Assistant

## 2022-07-26 ENCOUNTER — Telehealth (HOSPITAL_COMMUNITY): Payer: Self-pay | Admitting: *Deleted

## 2022-07-26 ENCOUNTER — Ambulatory Visit (INDEPENDENT_AMBULATORY_CARE_PROVIDER_SITE_OTHER): Payer: Medicaid Other | Admitting: *Deleted

## 2022-07-26 ENCOUNTER — Encounter (HOSPITAL_COMMUNITY): Payer: Self-pay

## 2022-07-26 VITALS — BP 119/97 | HR 106 | Resp 18 | Ht 73.0 in | Wt 248.0 lb

## 2022-07-26 DIAGNOSIS — F250 Schizoaffective disorder, bipolar type: Secondary | ICD-10-CM

## 2022-07-26 MED ORDER — ABILIFY MAINTENA 400 MG IM SRER: 400.0000 mg | INTRAMUSCULAR | 11 refills | Status: DC

## 2022-07-26 MED ORDER — ARIPIPRAZOLE ER 400 MG IM PRSY
400.0000 mg | PREFILLED_SYRINGE | Freq: Once | INTRAMUSCULAR | Status: AC
  Administered 2022-07-26: 400 mg via INTRAMUSCULAR

## 2022-07-26 NOTE — Progress Notes (Signed)
Provider was contacted by Elder Love, RN and was informed to send patient Abilify Maintena medication to [facility] pharmacy in [place], [place].  Patient's medication to be e-prescribed to pharmacy of choice.

## 2022-07-26 NOTE — Telephone Encounter (Signed)
Patient states that his pharmacy will be [facility] on [address] to send in his Abilify Maintena 400mg  injection. He will pick it up for his next visit once it is sent in to his pharmacy. Message sent to MD to send medication.

## 2022-07-26 NOTE — Progress Notes (Signed)
In as scheduled for his monthly inj of Abilify M 400 mg. Today he got his shot in his L GLUTEAL muscle without issue. He states he got sick and "deep into Jesus" but now is feeling better. States he is unsure of why he got more positive sx. He states he now as MCD and we will call his shot into [facility] in [place] and explained to him to pick it up each month before he comes to get his shot. He also gets food stamps and is still in process with his disability but has not gotten an outcome result yet. To return in 28 days for his next shot.

## 2022-07-26 NOTE — Telephone Encounter (Signed)
Message acknowledged and reviewed.  Patient's medications sent to pharmacy of choice.

## 2022-07-27 ENCOUNTER — Telehealth (HOSPITAL_COMMUNITY): Payer: Self-pay | Admitting: *Deleted

## 2022-07-27 NOTE — Telephone Encounter (Signed)
PA completed for patients Abilify M 400 mg inj every 30 days. PA # [id number] and its effective till 07/22/23. [facility] in [place] made aware of the approval.

## 2022-08-23 ENCOUNTER — Ambulatory Visit (HOSPITAL_COMMUNITY): Payer: MEDICAID

## 2022-08-24 ENCOUNTER — Encounter (HOSPITAL_COMMUNITY): Payer: Self-pay

## 2022-08-24 ENCOUNTER — Ambulatory Visit (INDEPENDENT_AMBULATORY_CARE_PROVIDER_SITE_OTHER): Payer: MEDICAID

## 2022-08-24 VITALS — BP 145/105 | HR 100 | Ht 73.0 in | Wt 249.0 lb

## 2022-08-24 DIAGNOSIS — F411 Generalized anxiety disorder: Secondary | ICD-10-CM | POA: Diagnosis not present

## 2022-08-24 DIAGNOSIS — G4700 Insomnia, unspecified: Secondary | ICD-10-CM

## 2022-08-24 DIAGNOSIS — F200 Paranoid schizophrenia: Secondary | ICD-10-CM

## 2022-08-24 MED ORDER — ARIPIPRAZOLE ER 400 MG IM SRER
400.0000 mg | Freq: Once | INTRAMUSCULAR | Status: AC
  Administered 2022-08-24: 400 mg via INTRAMUSCULAR

## 2022-08-24 NOTE — Progress Notes (Cosign Needed)
  PATIENT ARRIVED FOR INJECTION -- BY [facility] MA    ARIPiprazole ER (ABILIFY MAINTENA) 400 MG  TOLERATED WELL IN RIGHT GLUTEAL NO HI/SI NOR  AH/VH STATED HE'S DOING WELL

## 2022-09-21 ENCOUNTER — Encounter (HOSPITAL_COMMUNITY): Payer: Self-pay

## 2022-09-21 ENCOUNTER — Ambulatory Visit (INDEPENDENT_AMBULATORY_CARE_PROVIDER_SITE_OTHER): Payer: MEDICAID | Admitting: Psychiatry

## 2022-09-21 ENCOUNTER — Ambulatory Visit (HOSPITAL_COMMUNITY): Payer: MEDICAID | Admitting: *Deleted

## 2022-09-21 VITALS — BP 149/96 | HR 114 | Resp 16 | Ht 73.0 in | Wt 253.6 lb

## 2022-09-21 DIAGNOSIS — G4700 Insomnia, unspecified: Secondary | ICD-10-CM

## 2022-09-21 DIAGNOSIS — F900 Attention-deficit hyperactivity disorder, predominantly inattentive type: Secondary | ICD-10-CM

## 2022-09-21 DIAGNOSIS — F411 Generalized anxiety disorder: Secondary | ICD-10-CM

## 2022-09-21 DIAGNOSIS — F250 Schizoaffective disorder, bipolar type: Secondary | ICD-10-CM | POA: Diagnosis not present

## 2022-09-21 MED ORDER — ABILIFY MAINTENA 400 MG IM SRER: 400.0000 mg | INTRAMUSCULAR | 11 refills | Status: DC

## 2022-09-21 MED ORDER — ARIPIPRAZOLE ER 400 MG IM PRSY
400.0000 mg | PREFILLED_SYRINGE | Freq: Once | INTRAMUSCULAR | Status: AC
  Administered 2022-09-21: 400 mg via INTRAMUSCULAR

## 2022-09-21 MED ORDER — BUPROPION HCL ER (XL) 150 MG PO TB24: 150.0000 mg | ORAL_TABLET | ORAL | 3 refills | Status: DC

## 2022-09-21 NOTE — Progress Notes (Signed)
BH MD/PA/NP OP Progress Note  09/21/2022 11:57 AM DWAN COADY  MRN:  [id number]  Chief Complaint: "I have been down"  HPI: 42 year old male seen today for follow-up psychiatric evaluation.  He has a psychiatric history of substance-induced mood disorder, psychosis, schizoaffective disorder bipolar type, ADHD, anxiety, polysubstance use, insomnia, and paranoid schizophrenia.  Currently he is managed on Abilify 400 mg monthly.  He was prescribed trazodone however notes that he discontinued it because it caused nightmares.  Patient is prescribed Adderall 15 mg twice daily by his PCP.  He informed Clinical research associate that his medications are somewhat effective for managing his psychiatric conditions.  Today he was well-groomed, pleasant, cooperative, and engaged in conversation.  He informed Clinical research associate that he is up-and-down.  Patient notes that she sleeps excessively throughout the day.  When he is not sleeping he notes that he lacks motivation to do things that he once enjoyed.  He does note that he finds enjoyment in spending time with family and playing video games occasionally.  Patient informed writer that since his last visit his anxiety and depression has exacerbated.  Today provider conducted a GAD-7 and patient scored a 9.  Provider also conducted PHQ-9 and patient scored a 19.  He endorses fluctuations in appetite.  Patient notes that his weight fluctuates up and down as well.  Today he denies SI/HI/VAH, mania, paranoia.  Patient denies illegal drug use.  He does note that he vapes nicotine daily.  Patient informed writer that he has shoulder pain.  He notes that his left shoulder hurts and quantifies it as a 5 out of 10.  He informed Clinical research associate that he recently started physical therapy and was given a muscle relaxer to help manage this pain.  Today provider conducted an aims assessment and patient scored a 0.  Patient informed Clinical research associate that he is often forgetful, disorganized, and inattentive to mentally taxing  task.  Patient's PCP currently filled his Adderall.  Provider recommended patient trialing Wellbutrin to help manage concentration as well as depression.  He was agreeable to this.  Today Wellbutrin XL 150 mg daily started. Potential side effects of medication and risks vs benefits of treatment vs non-treatment were explained and discussed. All questions were answered. He will continue other medications as prescribed.  No other concerns at this time. Visit Diagnosis:    ICD-10-CM   1. Attention deficit hyperactivity disorder (ADHD), predominantly inattentive type  F90.0 buPROPion (WELLBUTRIN XL) 150 MG 24 hr tablet    2. Schizoaffective disorder, bipolar type (HCC)  F25.0 ARIPiprazole ER (ABILIFY MAINTENA) 400 MG SRER injection      Past Psychiatric History: substance-induced mood disorder, psychosis, schizoaffective disorder bipolar type, ADHD, anxiety, polysubstance use, insomnia, and paranoid schizophrenia  Past Medical History:  Past Medical History:  Diagnosis Date   ADHD (attention deficit hyperactivity disorder)    No past surgical history on file.  Family Psychiatric History: None reported   Family History: No family history on file.  Social History:  Social History   Socioeconomic History   Marital status: Single    Spouse name: Not on file   Number of children: Not on file   Years of education: Not on file   Highest education level: Not on file  Occupational History   Not on file  Tobacco Use   Smoking status: Former    Types: Cigarettes   Smokeless tobacco: Never  Vaping Use   Vaping status: Every Day   Substances: Nicotine  Substance and Sexual Activity  Alcohol use: Yes    Comment: occ   Drug use: Yes    Types: Marijuana   Sexual activity: Not on file  Other Topics Concern   Not on file  Social History Narrative   ** Merged History Encounter **       Social Determinants of Health   Financial Resource Strain: Not on file  Food Insecurity: Not on file   Transportation Needs: Not on file  Physical Activity: Not on file  Stress: Not on file  Social Connections: Not on file    Allergies: No Known Allergies  Metabolic Disorder Labs: Lab Results  Component Value Date   HGBA1C 5.5 01/20/2021   No results found for: "PROLACTIN" Lab Results  Component Value Date   CHOL 145 01/20/2021   TRIG 102 01/20/2021   HDL 49 01/20/2021   CHOLHDL 3.0 01/20/2021   LDLCALC 77 01/20/2021   Lab Results  Component Value Date   TSH 7.030 (H) 01/20/2021    Therapeutic Level Labs: No results found for: "LITHIUM" No results found for: "VALPROATE" No results found for: "CBMZ"  Current Medications: Current Outpatient Medications  Medication Sig Dispense Refill   albuterol (PROVENTIL HFA;VENTOLIN HFA) 108 (90 Base) MCG/ACT inhaler Inhale 2 puffs into the lungs every 6 (six) hours as needed for wheezing or shortness of breath.     amphetamine-dextroamphetamine (ADDERALL) 30 MG tablet Take 15 mg by mouth 2 (two) times daily.   0   ARIPiprazole ER (ABILIFY MAINTENA) 400 MG SRER injection Inject 2 mLs (400 mg total) into the muscle every 28 (twenty-eight) days. Max 1 injection per 28 days dispense brand name only 1 each 11   buPROPion (WELLBUTRIN XL) 150 MG 24 hr tablet Take 1 tablet (150 mg total) by mouth every morning. 30 tablet 3   cyclobenzaprine (FLEXERIL) 10 MG tablet Take 1 tablet (10 mg total) by mouth 2 (two) times daily as needed for muscle spasms. 20 tablet 0   lidocaine (LIDODERM) 5 % Place 1 patch onto the skin daily. Remove & Discard patch within 12 hours or as directed by MD 15 patch 0   predniSONE (DELTASONE) 20 MG tablet 3 Tabs PO Days 1-3, then 2 tabs PO Days 4-6, then 1 tab PO Day 7-9, then Half Tab PO Day 10-12 20 tablet 0   No current facility-administered medications for this visit.     Musculoskeletal: Strength & Muscle Tone: within normal limits Gait & Station: normal Patient leans: N/A  Psychiatric Specialty Exam: Review  of Systems  There were no vitals taken for this visit.There is no height or weight on file to calculate BMI.  General Appearance: Well Groomed  Eye Contact:  Good  Speech:  Clear and Coherent and Normal Rate  Volume:  Normal  Mood:  Depressed  Affect:  Appropriate and Congruent  Thought Process:  Coherent, Goal Directed, and Linear  Orientation:  Full (Time, Place, and Person)  Thought Content: WDL and Logical   Suicidal Thoughts:  No  Homicidal Thoughts:  No  Memory:  Immediate;   Good Recent;   Good Remote;   Good  Judgement:  Good  Insight:  Good  Psychomotor Activity:  Normal  Concentration:  Concentration: Fair and Attention Span: Fair  Recall:  Good  Fund of Knowledge: Good  Language: Good  Akathisia:  No  Handed:  Right  AIMS (if indicated): done, 0  Assets:  Communication Skills Desire for Improvement Housing Leisure Time Physical Health Social Support Talents/Skills  ADL's:  Intact  Cognition: WNL  Sleep:  Good   Screenings: AIMS    Flowsheet Row Office Visit from 09/21/2022 in [facility] Most recent reading at 09/21/2022 11:51 AM Clinical Support from 09/21/2022 in [facility] Most recent reading at 09/21/2022 11:47 AM Clinical Support from 06/05/2022 in [facility] Most recent reading at 06/05/2022  3:31 PM Office Visit from 04/28/2021 in [facility] Most recent reading at 04/28/2021  3:46 PM Office Visit from 06/01/2020 in [facility] Most recent reading at 06/01/2020  9:59 AM  AIMS Total Score 0 0 0 0 1      GAD-7    Flowsheet Row Office Visit from 09/21/2022 in [facility] Most recent reading at 09/21/2022 11:51 AM Clinical Support from 09/21/2022 in [facility] Most recent reading at 09/21/2022 11:48 AM Clinical Support from 06/05/2022 in [facility] Most recent reading at 06/05/2022 12:10 PM Office Visit from 04/13/2022 in [facility] Most recent reading at 04/13/2022  4:07 PM Office Visit from 10/13/2021 in [facility] Most recent reading at 10/13/2021  2:12 PM  Total GAD-7 Score 9 9 5 9  0      PHQ2-9    Flowsheet Row Office Visit from 09/21/2022 in [facility] Most recent reading at 09/21/2022 11:50 AM Clinical Support from 09/21/2022 in [facility] Most recent reading at 09/21/2022 11:49 AM Clinical Support from 06/05/2022 in [facility] Most recent reading at 06/05/2022 12:12 PM Office Visit from 04/13/2022 in [facility] Most recent reading at 04/13/2022  4:08 PM Office Visit from 10/13/2021 in [facility] Most recent reading at 10/13/2021  2:10 PM  PHQ-2 Total Score 5 5 0 3 0  PHQ-9 Total Score 19 19 11 6  --      Flowsheet Row Office Visit from 09/21/2022 in [facility] Most recent reading at 09/21/2022 11:50 AM Clinical Support from 09/21/2022 in [facility] Most recent reading at 09/21/2022 11:49 AM Clinical Support from 06/05/2022 in [facility] Most recent reading at 06/05/2022 12:12 PM  C-SSRS RISK CATEGORY No Risk No Risk No Risk        Assessment and Plan: Patient informed writer that his depression and ADHD continues to be problematic.Patient informed Clinical research associate that he is often forgetful, disorganized, and inattentive to mentally taxing task.  Patient's PCP currently filled his Adderall.  Provider recommended patient trialing Wellbutrin to help manage concentration as well as depression.  He was agreeable to this.  Today Wellbutrin XL 150 mg daily started.  He will continue other medications as prescribed.    1. Attention deficit  hyperactivity disorder (ADHD), predominantly inattentive type  Start- buPROPion (WELLBUTRIN XL) 150 MG 24 hr tablet; Take 1 tablet (150 mg total) by mouth every morning.  Dispense: 30 tablet; Refill: 3  2. Schizoaffective disorder, bipolar type (HCC)  Continue- ARIPiprazole ER (ABILIFY MAINTENA) 400 MG SRER injection; Inject 2 mLs (400 mg total) into the muscle every 28 (twenty-eight) days. Max 1 injection per 28 days dispense brand name only  Dispense: 1 each; Refill: 11    Collaboration of Care: Collaboration of Care: Other provider involved in patient's care AEB PCP  Patient/Guardian was advised Release of Information must be obtained prior to any  record release in order to collaborate their care with an outside provider. Patient/Guardian was advised if they have not already done so to contact the registration department to sign all necessary forms in order for [place] to release information regarding their care.   Consent: Patient/Guardian gives verbal consent for treatment and assignment of benefits for services provided during this visit. Patient/Guardian expressed understanding and agreed to proceed.   Follow up in 3 months for medication management Follow up with shot clinic in one month Shanna Cisco, NP 09/21/2022, 11:57 AM

## 2022-09-21 NOTE — Progress Notes (Cosign Needed)
Patient arrived for injection of Abilify Maintena 400mg  that he brought from his pharmacy. States that everything is going well. No side effects. No issues or complaints. Given in Left Upper Outer Quadrant. Pleasant and cooperative.

## 2022-10-19 ENCOUNTER — Ambulatory Visit (HOSPITAL_COMMUNITY): Payer: MEDICAID

## 2022-11-16 ENCOUNTER — Ambulatory Visit (INDEPENDENT_AMBULATORY_CARE_PROVIDER_SITE_OTHER): Payer: MEDICAID

## 2022-11-16 ENCOUNTER — Encounter (HOSPITAL_COMMUNITY): Payer: Self-pay

## 2022-11-16 VITALS — BP 129/97 | HR 68 | Wt 251.0 lb

## 2022-11-16 DIAGNOSIS — F411 Generalized anxiety disorder: Secondary | ICD-10-CM | POA: Diagnosis not present

## 2022-11-16 DIAGNOSIS — G4700 Insomnia, unspecified: Secondary | ICD-10-CM

## 2022-11-16 DIAGNOSIS — F200 Paranoid schizophrenia: Secondary | ICD-10-CM | POA: Diagnosis not present

## 2022-11-16 MED ORDER — ARIPIPRAZOLE ER 400 MG IM PRSY
400.0000 mg | PREFILLED_SYRINGE | Freq: Once | INTRAMUSCULAR | Status: AC
  Administered 2022-11-16: 400 mg via INTRAMUSCULAR

## 2022-11-16 NOTE — Progress Notes (Cosign Needed)
  PATIENT ARRIVED FOR INJECTION -- BY [facility] MA    ARIPiprazole ER (ABILIFY MAINTENA) 400 MG  TOLERATED WELL IN RIGHT GLUTEAL NO HI/SI NOR  AH/VH STATED HE'S DOING WELL

## 2022-12-14 ENCOUNTER — Encounter (HOSPITAL_COMMUNITY): Payer: Self-pay

## 2022-12-14 ENCOUNTER — Ambulatory Visit (INDEPENDENT_AMBULATORY_CARE_PROVIDER_SITE_OTHER): Payer: MEDICAID

## 2022-12-14 ENCOUNTER — Ambulatory Visit (INDEPENDENT_AMBULATORY_CARE_PROVIDER_SITE_OTHER): Payer: MEDICAID | Admitting: Psychiatry

## 2022-12-14 VITALS — BP 158/96 | HR 111 | Ht 73.0 in | Wt 251.0 lb

## 2022-12-14 DIAGNOSIS — F250 Schizoaffective disorder, bipolar type: Secondary | ICD-10-CM | POA: Diagnosis not present

## 2022-12-14 DIAGNOSIS — F200 Paranoid schizophrenia: Secondary | ICD-10-CM

## 2022-12-14 MED ORDER — ABILIFY MAINTENA 400 MG IM SRER: 400.0000 mg | INTRAMUSCULAR | 11 refills | Status: DC

## 2022-12-14 MED ORDER — ARIPIPRAZOLE ER 400 MG IM SRER
400.0000 mg | Freq: Once | INTRAMUSCULAR | Status: AC
  Administered 2022-12-14: 400 mg via INTRAMUSCULAR

## 2022-12-14 NOTE — Progress Notes (Cosign Needed)
Patient arrives today for his due injection of Abilify Maintena 400 mg that he brought with him. Patient is pleasant and well groomed. Patient denies any SI/HI or AVH. Injection was prepared as ordered and administered in patients LUOQ. Patient tolerated well and without complaint.     NDC: 93235-573-22 LOT: [id number] EXP: [id number]

## 2022-12-14 NOTE — Progress Notes (Signed)
BH MD/PA/NP OP Progress Note  12/14/2022 12:01 PM Dale Calderon  MRN:  [id number]  Chief Complaint: "I am no longer down"  HPI: 42 year old male seen today for follow-up psychiatric evaluation.  He has a psychiatric history of substance-induced mood disorder, psychosis, schizoaffective disorder bipolar type, ADHD, anxiety, polysubstance use, insomnia, and paranoid schizophrenia.  Currently he is managed on Abilify 400 mg monthly and Wellbutrin XL 150 mg.  Patient is prescribed Adderall 15 mg twice daily by his PCP.  He informed Clinical research associate that he no longer takes Wellbutrin and reports that his other medications are  effective for managing his psychiatric conditions.  Today he was well-groomed, pleasant, cooperative, and engaged in conversation.  He informed Clinical research associate that he is no longer down.  Patient notes that his mood is more stable and reports that his anxiety/depression has improved. Today provider conducted a GAD-7 and patient scored a 4, at his last visit he scored a 9.  Provider also conducted PHQ-9 and patient scored a 6, at his last visit he scored a 19.  He endorses adequate in sleep and appetite.  Today he denies SI/HI/VAH, mania, paranoia.  Patient denies illegal drug use.    Today provider conducted an aims assessment and patient scored a 0.  Today Wellbutrin discontinued. He will continue all other medications as prescribed.  No other concerns at this time. Visit Diagnosis:    ICD-10-CM   1. Schizoaffective disorder, bipolar type (HCC)  F25.0 ARIPiprazole ER (ABILIFY MAINTENA) 400 MG SRER injection       Past Psychiatric History: substance-induced mood disorder, psychosis, schizoaffective disorder bipolar type, ADHD, anxiety, polysubstance use, insomnia, and paranoid schizophrenia  Past Medical History:  Past Medical History:  Diagnosis Date   ADHD (attention deficit hyperactivity disorder)    History reviewed. No pertinent surgical history.  Family Psychiatric History: None  reported   Family History: History reviewed. No pertinent family history.  Social History:  Social History   Socioeconomic History   Marital status: Single    Spouse name: Not on file   Number of children: Not on file   Years of education: Not on file   Highest education level: Not on file  Occupational History   Not on file  Tobacco Use   Smoking status: Former    Types: Cigarettes   Smokeless tobacco: Never  Vaping Use   Vaping status: Every Day   Substances: Nicotine  Substance and Sexual Activity   Alcohol use: Yes    Comment: occ   Drug use: Yes    Types: Marijuana   Sexual activity: Not on file  Other Topics Concern   Not on file  Social History Narrative   ** Merged History Encounter **       Social Determinants of Health   Financial Resource Strain: Not on file  Food Insecurity: Not on file  Transportation Needs: Not on file  Physical Activity: Not on file  Stress: Not on file  Social Connections: Not on file    Allergies: No Known Allergies  Metabolic Disorder Labs: Lab Results  Component Value Date   HGBA1C 5.5 01/20/2021   No results found for: "PROLACTIN" Lab Results  Component Value Date   CHOL 145 01/20/2021   TRIG 102 01/20/2021   HDL 49 01/20/2021   CHOLHDL 3.0 01/20/2021   LDLCALC 77 01/20/2021   Lab Results  Component Value Date   TSH 7.030 (H) 01/20/2021    Therapeutic Level Labs: No results found for: "LITHIUM" No results  found for: "VALPROATE" No results found for: "CBMZ"  Current Medications: Current Outpatient Medications  Medication Sig Dispense Refill   albuterol (PROVENTIL HFA;VENTOLIN HFA) 108 (90 Base) MCG/ACT inhaler Inhale 2 puffs into the lungs every 6 (six) hours as needed for wheezing or shortness of breath.     amphetamine-dextroamphetamine (ADDERALL) 30 MG tablet Take 15 mg by mouth 2 (two) times daily.   0   ARIPiprazole ER (ABILIFY MAINTENA) 400 MG SRER injection Inject 2 mLs (400 mg total) into the muscle  every 28 (twenty-eight) days. Max 1 injection per 28 days dispense brand name only 1 each 11   cyclobenzaprine (FLEXERIL) 10 MG tablet Take 1 tablet (10 mg total) by mouth 2 (two) times daily as needed for muscle spasms. 20 tablet 0   lidocaine (LIDODERM) 5 % Place 1 patch onto the skin daily. Remove & Discard patch within 12 hours or as directed by MD 15 patch 0   predniSONE (DELTASONE) 20 MG tablet 3 Tabs PO Days 1-3, then 2 tabs PO Days 4-6, then 1 tab PO Day 7-9, then Half Tab PO Day 10-12 20 tablet 0   No current facility-administered medications for this visit.     Musculoskeletal: Strength & Muscle Tone: within normal limits Gait & Station: normal Patient leans: N/A  Psychiatric Specialty Exam: Review of Systems  There were no vitals taken for this visit.There is no height or weight on file to calculate BMI.  General Appearance: Well Groomed  Eye Contact:  Good  Speech:  Clear and Coherent and Normal Rate  Volume:  Normal  Mood:  Depressed  Affect:  Appropriate and Congruent  Thought Process:  Coherent, Goal Directed, and Linear  Orientation:  Full (Time, Place, and Person)  Thought Content: WDL and Logical   Suicidal Thoughts:  No  Homicidal Thoughts:  No  Memory:  Immediate;   Good Recent;   Good Remote;   Good  Judgement:  Good  Insight:  Good  Psychomotor Activity:  Normal  Concentration:  Concentration: Fair and Attention Span: Fair  Recall:  Good  Fund of Knowledge: Good  Language: Good  Akathisia:  No  Handed:  Right  AIMS (if indicated): done, 0  Assets:  Communication Skills Desire for Improvement Housing Leisure Time Physical Health Social Support Talents/Skills  ADL's:  Intact  Cognition: WNL  Sleep:  Good   Screenings: AIMS    Flowsheet Row Office Visit from 09/21/2022 in [facility] Most recent reading at 09/21/2022 11:51 AM Clinical Support from 09/21/2022 in [facility] Most recent  reading at 09/21/2022 11:47 AM Clinical Support from 06/05/2022 in [facility] Most recent reading at 06/05/2022  3:31 PM Office Visit from 04/28/2021 in [facility] Most recent reading at 04/28/2021  3:46 PM Office Visit from 06/01/2020 in [facility] Most recent reading at 06/01/2020  9:59 AM  AIMS Total Score 0 0 0 0 1      GAD-7    Flowsheet Row Office Visit from 12/14/2022 in [facility] Most recent reading at 12/14/2022 11:55 AM Office Visit from 09/21/2022 in [facility] Most recent reading at 09/21/2022 11:51 AM Clinical Support from 09/21/2022 in [facility] Most recent reading at 09/21/2022 11:48 AM Clinical Support from 06/05/2022 in [facility] Most recent reading at 06/05/2022 12:10 PM Office Visit from 04/13/2022 in [facility] Most recent reading at 04/13/2022  4:07 PM  Total GAD-7 Score 4 9 9 5 9       PHQ2-9    Flowsheet Row Office Visit from 12/14/2022 in [facility] Most recent reading at 12/14/2022 11:55 AM Office Visit from 09/21/2022 in [facility] Most recent reading at 09/21/2022 11:50 AM Clinical Support from 09/21/2022 in [facility] Most recent reading at 09/21/2022 11:49 AM Clinical Support from 06/05/2022 in [facility] Most recent reading at 06/05/2022 12:12 PM Office Visit from 04/13/2022 in [facility] Most recent reading at 04/13/2022  4:08 PM  PHQ-2 Total Score 2 5 5  0 3  PHQ-9 Total Score 6 19 19 11 6       Flowsheet Row Office Visit from 12/14/2022 in [facility] Most recent reading at 12/14/2022 11:55 AM Office Visit from 09/21/2022 in [facility] Most  recent reading at 09/21/2022 11:50 AM Clinical Support from 09/21/2022 in [facility] Most recent reading at 09/21/2022 11:49 AM  C-SSRS RISK CATEGORY No Risk No Risk No Risk        Assessment and Plan: Patient informed writer that his depression and ADHD continues to be problematic.Patient informed Clinical research associate that he is often forgetful, disorganized, and inattentive to mentally taxing task.  Patient's PCP currently filled his Adderall.  Provider recommended patient trialing Wellbutrin to help manage concentration as well as depression.  He was agreeable to this.  Today Wellbutrin XL 150 mg daily started.  He will continue other medications as prescribed.    1. Attention deficit hyperactivity disorder (ADHD), predominantly inattentive type  Start- buPROPion (WELLBUTRIN XL) 150 MG 24 hr tablet; Take 1 tablet (150 mg total) by mouth every morning.  Dispense: 30 tablet; Refill: 3  2. Schizoaffective disorder, bipolar type (HCC)  Continue- ARIPiprazole ER (ABILIFY MAINTENA) 400 MG SRER injection; Inject 2 mLs (400 mg total) into the muscle every 28 (twenty-eight) days. Max 1 injection per 28 days dispense brand name only  Dispense: 1 each; Refill: 11    Collaboration of Care: Collaboration of Care: Other provider involved in patient's care AEB PCP  Patient/Guardian was advised Release of Information must be obtained prior to any record release in order to collaborate their care with an outside provider. Patient/Guardian was advised if they have not already done so to contact the registration department to sign all necessary forms in order for [place] to release information regarding their care.   Consent: Patient/Guardian gives verbal consent for treatment and assignment of benefits for services provided during this visit. Patient/Guardian expressed understanding and agreed to proceed.   Follow up in 3 months for medication management Follow up with shot clinic in one  month Shanna Cisco, NP 12/14/2022, 12:01 PM

## 2023-01-11 ENCOUNTER — Ambulatory Visit (INDEPENDENT_AMBULATORY_CARE_PROVIDER_SITE_OTHER): Payer: MEDICAID

## 2023-01-11 ENCOUNTER — Encounter (HOSPITAL_COMMUNITY): Payer: Self-pay

## 2023-01-11 VITALS — BP 147/107 | HR 100 | Ht 73.0 in | Wt 252.0 lb

## 2023-01-11 DIAGNOSIS — G4700 Insomnia, unspecified: Secondary | ICD-10-CM

## 2023-01-11 DIAGNOSIS — F411 Generalized anxiety disorder: Secondary | ICD-10-CM

## 2023-01-11 DIAGNOSIS — F200 Paranoid schizophrenia: Secondary | ICD-10-CM | POA: Diagnosis not present

## 2023-01-11 MED ORDER — ARIPIPRAZOLE ER 400 MG IM PRSY
400.0000 mg | PREFILLED_SYRINGE | Freq: Once | INTRAMUSCULAR | Status: AC
  Administered 2023-01-11: 400 mg via INTRAMUSCULAR

## 2023-01-11 NOTE — Progress Notes (Cosign Needed)
  PATIENT ARRIVED FOR INJECTION -- BY [facility] MA    ARIPiprazole ER (ABILIFY MAINTENA) 400 MG  TOLERATED WELL IN RIGHT GLUTEAL NO HI/SI NOR  AH/VH STATED HE'S DOING WELL

## 2023-02-08 ENCOUNTER — Telehealth (HOSPITAL_COMMUNITY): Payer: Self-pay | Admitting: *Deleted

## 2023-02-08 ENCOUNTER — Ambulatory Visit (HOSPITAL_COMMUNITY): Payer: MEDICAID

## 2023-02-08 NOTE — Telephone Encounter (Signed)
 Late for shot so writer called [employer] and spoke with his sister. His sister told me he would be coming tues am and not today. Will add him to the schedule for tues.

## 2023-02-13 ENCOUNTER — Ambulatory Visit (HOSPITAL_COMMUNITY): Payer: MEDICAID

## 2023-02-14 ENCOUNTER — Ambulatory Visit (HOSPITAL_COMMUNITY): Payer: No Payment, Other

## 2023-02-27 ENCOUNTER — Ambulatory Visit (INDEPENDENT_AMBULATORY_CARE_PROVIDER_SITE_OTHER): Payer: MEDICAID

## 2023-02-27 ENCOUNTER — Encounter (HOSPITAL_COMMUNITY): Payer: Self-pay

## 2023-02-27 ENCOUNTER — Ambulatory Visit (INDEPENDENT_AMBULATORY_CARE_PROVIDER_SITE_OTHER): Payer: MEDICAID | Admitting: Psychiatry

## 2023-02-27 VITALS — BP 147/100 | HR 90 | Ht 73.0 in | Wt 246.6 lb

## 2023-02-27 DIAGNOSIS — F250 Schizoaffective disorder, bipolar type: Secondary | ICD-10-CM | POA: Diagnosis not present

## 2023-02-27 DIAGNOSIS — F1910 Other psychoactive substance abuse, uncomplicated: Secondary | ICD-10-CM | POA: Diagnosis not present

## 2023-02-27 MED ORDER — ABILIFY MAINTENA 400 MG IM SRER: 400.0000 mg | INTRAMUSCULAR | 11 refills | Status: DC

## 2023-02-27 MED ORDER — ARIPIPRAZOLE ER 400 MG IM SRER
400.0000 mg | Freq: Once | INTRAMUSCULAR | Status: AC
  Administered 2023-02-27: 400 mg via INTRAMUSCULAR

## 2023-02-27 NOTE — Progress Notes (Cosign Needed)
Pt presents today for injection of abilify Maintenna 400 mg administered in left glute. Pt tolerated injection with no complainants. Pt denies any AVH, SI, and HI. Pt states that his blood pressure was high today due to the mixture of coffee and his medicine adderall. Pt says he had a nice weekend to catch up with friends and seems to be in good spirt's when of Abilify.

## 2023-02-27 NOTE — Progress Notes (Signed)
BH MD/PA/NP OP Progress Note  02/27/2023 1:16 PM Dale Calderon  MRN:  [id number]  Chief Complaint: "Everything is good"  HPI: 43 year old male seen today for follow-up psychiatric evaluation.  He has a psychiatric history of substance-induced mood disorder, psychosis, schizoaffective disorder bipolar type, ADHD, anxiety, polysubstance use, insomnia, and paranoid schizophrenia.  Currently he is managed on Abilify 400 mg monthly.  Patient is prescribed Adderall 15 mg twice daily by his PCP.  He informed Clinical research associate that his medication is  effective for managing his psychiatric conditions.  Today he was well-groomed, pleasant, cooperative, and engaged in conversation.  He informed Clinical research associate that everything is good. Patient notes that his mood is more stable and reports that his anxiety/depression is well managed. Today provider conducted a GAD-7 and patient scored a 4, at his last visit he scored a 4.  Provider also conducted PHQ-9 and patient scored a 2, at his last visit he scored a 6.  He endorses adequate in sleep and appetite.  Today he denies SI/HI/VAH, mania, paranoia.  Today provider conducted an aims assessment and patient scored a 0.  No medication changes made today. Patient agreeable to continue medications as prescribed.  No other concerns at this time. Visit Diagnosis:    ICD-10-CM   1. Schizoaffective disorder, bipolar type (HCC)  F25.0 ARIPiprazole ER (ABILIFY MAINTENA) 400 MG SRER injection        Past Psychiatric History: substance-induced mood disorder, psychosis, schizoaffective disorder bipolar type, ADHD, anxiety, polysubstance use, insomnia, and paranoid schizophrenia  Past Medical History:  Past Medical History:  Diagnosis Date   ADHD (attention deficit hyperactivity disorder)    No past surgical history on file.  Family Psychiatric History: None reported   Family History: No family history on file.  Social History:  Social History   Socioeconomic History   Marital  status: Single    Spouse name: Not on file   Number of children: Not on file   Years of education: Not on file   Highest education level: Not on file  Occupational History   Not on file  Tobacco Use   Smoking status: Former    Types: Cigarettes   Smokeless tobacco: Never  Vaping Use   Vaping status: Every Day   Substances: Nicotine  Substance and Sexual Activity   Alcohol use: Yes    Comment: occ   Drug use: Yes    Types: Marijuana   Sexual activity: Not on file  Other Topics Concern   Not on file  Social History Narrative   ** Merged History Encounter **       Social Drivers of Health   Financial Resource Strain: Not on file  Food Insecurity: Low Risk  (02/13/2023)   Received from [facility]   Hunger Vital Sign    Worried About Running Out of Food in the Last Year: Never true    Ran Out of Food in the Last Year: Never true  Transportation Needs: No Transportation Needs (02/13/2023)   Received from [employer]    In the past 12 months, has lack of reliable transportation kept you from medical appointments, meetings, work or from getting things needed for daily living? : No  Physical Activity: Not on file  Stress: Not on file  Social Connections: Not on file    Allergies: No Known Allergies  Metabolic Disorder Labs: Lab Results  Component Value Date   HGBA1C 5.5 01/20/2021   No results found for: "PROLACTIN" Lab Results  Component Value Date   CHOL 145 01/20/2021   TRIG 102 01/20/2021   HDL 49 01/20/2021   CHOLHDL 3.0 01/20/2021   LDLCALC 77 01/20/2021   Lab Results  Component Value Date   TSH 7.030 (H) 01/20/2021    Therapeutic Level Labs: No results found for: "LITHIUM" No results found for: "VALPROATE" No results found for: "CBMZ"  Current Medications: Current Outpatient Medications  Medication Sig Dispense Refill   albuterol (PROVENTIL HFA;VENTOLIN HFA) 108 (90 Base) MCG/ACT inhaler Inhale 2 puffs into the lungs every 6  (six) hours as needed for wheezing or shortness of breath.     amphetamine-dextroamphetamine (ADDERALL) 30 MG tablet Take 15 mg by mouth 2 (two) times daily.   0   ARIPiprazole ER (ABILIFY MAINTENA) 400 MG SRER injection Inject 2 mLs (400 mg total) into the muscle every 28 (twenty-eight) days. Max 1 injection per 28 days dispense brand name only 1 each 11   cyclobenzaprine (FLEXERIL) 10 MG tablet Take 1 tablet (10 mg total) by mouth 2 (two) times daily as needed for muscle spasms. 20 tablet 0   lidocaine (LIDODERM) 5 % Place 1 patch onto the skin daily. Remove & Discard patch within 12 hours or as directed by MD 15 patch 0   predniSONE (DELTASONE) 20 MG tablet 3 Tabs PO Days 1-3, then 2 tabs PO Days 4-6, then 1 tab PO Day 7-9, then Half Tab PO Day 10-12 20 tablet 0   No current facility-administered medications for this visit.     Musculoskeletal: Strength & Muscle Tone: within normal limits Gait & Station: normal Patient leans: N/A  Psychiatric Specialty Exam: Review of Systems  There were no vitals taken for this visit.There is no height or weight on file to calculate BMI.  General Appearance: Well Groomed  Eye Contact:  Good  Speech:  Clear and Coherent and Normal Rate  Volume:  Normal  Mood:  Depressed  Affect:  Appropriate and Congruent  Thought Process:  Coherent, Goal Directed, and Linear  Orientation:  Full (Time, Place, and Person)  Thought Content: WDL and Logical   Suicidal Thoughts:  No  Homicidal Thoughts:  No  Memory:  Immediate;   Good Recent;   Good Remote;   Good  Judgement:  Good  Insight:  Good  Psychomotor Activity:  Normal  Concentration:  Concentration: Good and Attention Span: Good  Recall:  Good  Fund of Knowledge: Good  Language: Good  Akathisia:  No  Handed:  Right  AIMS (if indicated): done, 0  Assets:  Communication Skills Desire for Improvement Housing Leisure Time Physical Health Social Support Talents/Skills  ADL's:  Intact  Cognition:  WNL  Sleep:  Good   Screenings: AIMS    Flowsheet Row Office Visit from 09/21/2022 in [facility] Most recent reading at 09/21/2022 11:51 AM Clinical Support from 09/21/2022 in [facility] Most recent reading at 09/21/2022 11:47 AM Clinical Support from 06/05/2022 in [facility] Most recent reading at 06/05/2022  3:31 PM Office Visit from 04/28/2021 in [facility] Most recent reading at 04/28/2021  3:46 PM Office Visit from 06/01/2020 in [facility] Most recent reading at 06/01/2020  9:59 AM  AIMS Total Score 0 0 0 0 1      GAD-7    Flowsheet Row Office Visit from 02/27/2023 in [facility] Most recent reading at 02/27/2023  1:16 PM Office Visit from 12/14/2022  in [facility] Most recent reading at 12/14/2022 11:55 AM Office Visit from 09/21/2022 in [facility] Most recent reading at 09/21/2022 11:51 AM Clinical Support from 09/21/2022 in [facility] Most recent reading at 09/21/2022 11:48 AM Clinical Support from 06/05/2022 in [facility] Most recent reading at 06/05/2022 12:10 PM  Total GAD-7 Score 2 4 9 9 5       PHQ2-9    Flowsheet Row Office Visit from 02/27/2023 in [facility] Most recent reading at 02/27/2023  1:16 PM Office Visit from 12/14/2022 in [facility] Most recent reading at 12/14/2022 11:55 AM Office Visit from 09/21/2022 in [facility] Most recent reading at 09/21/2022 11:50 AM Clinical Support from 09/21/2022 in [facility] Most recent reading at 09/21/2022 11:49 AM Clinical Support from 06/05/2022 in [facility] Most recent reading at 06/05/2022 12:12 PM  PHQ-2  Total Score 2 2 5 5  0  PHQ-9 Total Score 4 6 19 19 11       Flowsheet Row Office Visit from 02/27/2023 in [facility] Office Visit from 12/14/2022 in [facility] Office Visit from 09/21/2022 in [facility]  C-SSRS RISK CATEGORY Error: Q3, 4, or 5 should not be populated when Q2 is No No Risk No Risk        Assessment and Plan: Patient notes that he is doing well on his current medication regimen. No medication changes made today. Patient agreeable to continue medications as prescribed.  1. Schizoaffective disorder, bipolar type (HCC)  Continue- ARIPiprazole ER (ABILIFY MAINTENA) 400 MG SRER injection; Inject 2 mLs (400 mg total) into the muscle every 28 (twenty-eight) days. Max 1 injection per 28 days dispense brand name only  Dispense: 1 each; Refill: 11   Collaboration of Care: Collaboration of Care: Other provider involved in patient's care AEB PCP  Patient/Guardian was advised Release of Information must be obtained prior to any record release in order to collaborate their care with an outside provider. Patient/Guardian was advised if they have not already done so to contact the registration department to sign all necessary forms in order for [place] to release information regarding their care.   Consent: Patient/Guardian gives verbal consent for treatment and assignment of benefits for services provided during this visit. Patient/Guardian expressed understanding and agreed to proceed.   Follow up in 3 months for medication management Follow up with shot clinic in one month Shanna Cisco, NP 02/27/2023, 1:16 PM

## 2023-03-08 ENCOUNTER — Ambulatory Visit (HOSPITAL_COMMUNITY): Payer: No Payment, Other

## 2023-03-27 ENCOUNTER — Ambulatory Visit (HOSPITAL_COMMUNITY): Payer: MEDICAID

## 2023-04-05 ENCOUNTER — Ambulatory Visit (INDEPENDENT_AMBULATORY_CARE_PROVIDER_SITE_OTHER): Payer: MEDICAID

## 2023-04-05 VITALS — BP 139/109 | HR 90 | Ht 73.0 in | Wt 251.0 lb

## 2023-04-05 DIAGNOSIS — F1910 Other psychoactive substance abuse, uncomplicated: Secondary | ICD-10-CM | POA: Diagnosis not present

## 2023-04-05 DIAGNOSIS — F250 Schizoaffective disorder, bipolar type: Secondary | ICD-10-CM | POA: Diagnosis not present

## 2023-04-05 MED ORDER — ARIPIPRAZOLE ER 400 MG IM SRER
400.0000 mg | Freq: Once | INTRAMUSCULAR | Status: AC
  Administered 2023-04-05: 400 mg via INTRAMUSCULAR

## 2023-04-05 NOTE — Progress Notes (Cosign Needed)
 Pt presents today for injection of Abilify Maintena 400-mg administered in right upper outer quadrant. Pt has no complaints  regarding injection.    Pt states that he is doing well overall and has no eerie feeling regarding medication. Pt states that he is following up with PCP about BP.

## 2023-05-03 ENCOUNTER — Encounter (HOSPITAL_COMMUNITY): Payer: Self-pay

## 2023-05-03 ENCOUNTER — Ambulatory Visit (INDEPENDENT_AMBULATORY_CARE_PROVIDER_SITE_OTHER): Payer: MEDICAID

## 2023-05-03 VITALS — BP 134/104 | HR 104 | Wt 252.2 lb

## 2023-05-03 DIAGNOSIS — F200 Paranoid schizophrenia: Secondary | ICD-10-CM

## 2023-05-03 DIAGNOSIS — F411 Generalized anxiety disorder: Secondary | ICD-10-CM

## 2023-05-03 DIAGNOSIS — G4700 Insomnia, unspecified: Secondary | ICD-10-CM

## 2023-05-03 MED ORDER — ARIPIPRAZOLE ER 400 MG IM PRSY
400.0000 mg | PREFILLED_SYRINGE | Freq: Once | INTRAMUSCULAR | Status: AC
  Administered 2023-05-03: 400 mg via INTRAMUSCULAR

## 2023-05-03 NOTE — Progress Notes (Cosign Needed)
 PATIENT ARRIVED FOR INJECTION -- BY [facility] MA    ARIPiprazole ER (ABILIFY MAINTENA) 400 MG  TOLERATED WELL IN LEFT GLUTEAL NO HI/SI NOR  AH/VH STATED HE'S DOING WELL

## 2023-05-29 ENCOUNTER — Encounter (HOSPITAL_COMMUNITY): Payer: MEDICAID | Admitting: Psychiatry

## 2023-05-31 ENCOUNTER — Encounter (HOSPITAL_COMMUNITY): Payer: Self-pay

## 2023-05-31 ENCOUNTER — Ambulatory Visit (INDEPENDENT_AMBULATORY_CARE_PROVIDER_SITE_OTHER): Payer: MEDICAID

## 2023-05-31 VITALS — BP 126/97 | HR 109 | Wt 248.6 lb

## 2023-05-31 DIAGNOSIS — F411 Generalized anxiety disorder: Secondary | ICD-10-CM | POA: Diagnosis not present

## 2023-05-31 DIAGNOSIS — F250 Schizoaffective disorder, bipolar type: Secondary | ICD-10-CM

## 2023-05-31 DIAGNOSIS — F200 Paranoid schizophrenia: Secondary | ICD-10-CM

## 2023-05-31 DIAGNOSIS — G4700 Insomnia, unspecified: Secondary | ICD-10-CM

## 2023-05-31 MED ORDER — ARIPIPRAZOLE ER 400 MG IM PRSY
400.0000 mg | PREFILLED_SYRINGE | Freq: Once | INTRAMUSCULAR | Status: AC
  Administered 2023-05-31: 400 mg via INTRAMUSCULAR

## 2023-05-31 NOTE — Progress Notes (Unsigned)
 BH MD Outpatient Progress Note  06/01/2023 8:20 AM Dale Calderon  MRN:  [id number]  Assessment:  Dale Calderon is a 43 y.o. male with a history of substance induced mood disorder, schizoaffective disorder bipolar type, ADHD, anxiety, polysubstance use, insomnia who is an established patient with [facility] for [facility] clinic. He is prescribed Adderall 30 mg twice a day with an additional 10 mg as needed for ADHD. At this point, I am not certain whether patient's psychotic symptoms are secondary to substances or primary thought disorder. I will reach out to patient's PCP to discuss decreasing his adderall dosage given it is likely putting him at higher risk of psychosis. He was somewhat hyperverbal during the conversation.   Today, 06/01/2023, he was amenable to decreasing adderall dosage given he does not require as high of a dose anymore. He also reports to only taking 20 mg in AM and 20 mg in PM with 10 mg prn. Left message with patient's PCP to discuss tapering his medication or transitioning him to adderall xr as to reduce pill burden.   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    Patient and plan of care will be discussed with the Attending MD, Dr. Eligio Grumbling, who agrees with the above statement and plan.   Subjective:  Chief Complaint: Medication Management   Interval History:  Patient seen for [facility]I clinic.  Denies any acute somatic complaints at this time besides a left shoulder pinched nerve.  Patient reports eating and sleeping well.  Patient reports struggling with some anxiety and mild depression given he has not worked in several years and feels unproductive playing video games all day.  Patient is currently still awaiting disability.  He denies present SI/HI/AVH.  Patient denies paranoia or hyperreligiosity.  Patient reports that his last experience of this was several years ago when he was hospitalized where he thought that he was "literally covered in demons" and required a religious cleansing of some sort.  We discussed concerns regarding having such a high dose of Adderall which may be contributing to his anxiety as well that put him at risk of decompensation.  Visit Diagnosis:    ICD-10-CM   1. Schizoaffective disorder, bipolar type (HCC)  F25.0       Past Psychiatric History:  Diagnoses: Schizoaffective disorder, bipolar type, substance-induced psychosis, substance-induced mood disorder, cocaine abuse, opiate use disorder Previous psychiatrist/therapist: [facility] Hospitalizations: 1  Past Medical History:  Past Medical History:  Diagnosis Date   ADHD (attention deficit hyperactivity disorder)     No past surgical history on file.   Family History:  No family history on file.  Social History:  Academic/Vocational: unemployed, seeking disability Social History   Socioeconomic History  Marital status: Single    Spouse name: Not on file   Number of children: Not on file   Years of education: Not on file   Highest education level: Not on file  Occupational History   Not on file  Tobacco Use   Smoking  status: Former    Types: Cigarettes   Smokeless tobacco: Never  Vaping Use   Vaping status: Every Day   Substances: Nicotine  Substance and Sexual Activity   Alcohol use: Yes    Comment: occ   Drug use: Yes    Types: Marijuana   Sexual activity: Not on file  Other Topics Concern   Not on file  Social History Narrative   ** Merged History Encounter **       Social Drivers of Health   Financial Resource Strain: Not on file  Food Insecurity: Low Risk  (02/13/2023)   Received from [facility]   Hunger Vital Sign    Worried About Running Out of Food in the Last Year: Never true    Ran Out of Food in the Last Year: Never true  Transportation Needs: No Transportation Needs (02/13/2023)   Received from [employer]    In the past 12 months, has lack of reliable transportation kept you from medical appointments, meetings, work or from getting things needed for daily living? : No  Physical Activity: Not on file  Stress: Not on file  Social Connections: Not on file    Allergies:  No Known Allergies  Current Medications: Current Outpatient Medications  Medication Sig Dispense Refill   amphetamine-dextroamphetamine (ADDERALL) 20 MG tablet Take 20 mg by mouth. Take 1.5 tablets (30 mg total) by mouth 2 times daily. May take another 1/2 tablet per day if needed.     albuterol  (PROVENTIL  HFA;VENTOLIN  HFA) 108 (90 Base) MCG/ACT inhaler Inhale 2 puffs into the lungs every 6 (six) hours as needed for wheezing or shortness of breath.     ARIPiprazole  ER (ABILIFY  MAINTENA) 400 MG SRER injection Inject 2 mLs (400 mg total) into the muscle every 28 (twenty-eight) days. Max 1 injection per 28 days dispense brand name only 1 each 11   cyclobenzaprine  (FLEXERIL ) 10 MG tablet Take 1 tablet (10 mg total) by mouth 2 (two) times daily as needed for muscle spasms. 20 tablet 0   lidocaine  (LIDODERM ) 5 % Place 1 patch onto the skin daily. Remove & Discard patch within 12 hours or as  directed by MD 15 patch 0   predniSONE  (DELTASONE ) 20 MG tablet 3 Tabs PO Days 1-3, then 2 tabs PO Days 4-6, then 1 tab PO Day 7-9, then Half Tab PO Day 10-12 20 tablet 0   No current facility-administered medications for this visit.    ROS: Review of Systems   Objective:  Psychiatric Specialty Exam: There were no vitals taken for this visit.There is no height or weight on file to calculate BMI.  General Appearance: Casual  Eye Contact:  Fair  Speech:  Pressured  Volume:  Increased  Mood:  Euphoric  Affect:  Appropriate and Congruent  Thought Content: Logical   Suicidal Thoughts:  No  Homicidal Thoughts:  No  Thought Process:  Coherent, Goal Directed, and Linear  Orientation:  Full (Time, Place, and Person)  Judgment:  Intact  Insight:  Fair  Concentration:  Concentration: Fair  Fund of Knowledge: Fair  Language: Fair  Psychomotor Activity:  Increased  Akathisia:  No  AIMS (if indicated): done, 0  Assets:  Communication Skills Desire for Improvement Financial Resources/Insurance Housing Leisure Time Physical Health Resilience  ADL's:  Intact  Cognition: WNL    PE: General: well-appearing; no acute distress  Pulm: no increased work of breathing on room air  Strength & Muscle Tone: within normal limits Neuro: no focal neurological deficits observed  Gait & Station: normal    Screenings: AIMS    Flowsheet Row Office Visit from 02/27/2023 in [facility] Most recent reading at 02/27/2023  1:17 PM Office Visit from 09/21/2022 in [facility] Most recent reading at 09/21/2022 11:51 AM Clinical Support from 09/21/2022 in [facility] Most recent reading at 09/21/2022 11:47 AM Clinical Support from 06/05/2022 in [facility] Most recent reading at 06/05/2022  3:31 PM Office Visit from 04/28/2021 in [facility] Most recent reading at  04/28/2021  3:46 PM  AIMS Total Score 0 0 0 0 0      GAD-7    Flowsheet Row Office Visit from 02/27/2023 in [facility] Most recent reading at 02/27/2023  1:16 PM Office Visit from 12/14/2022 in [facility] Most recent reading at 12/14/2022 11:55 AM Office Visit from 09/21/2022 in [facility] Most recent reading at 09/21/2022 11:51 AM Clinical Support from 09/21/2022 in [facility] Most recent reading at 09/21/2022 11:48 AM Clinical Support from 06/05/2022 in [facility] Most recent reading at 06/05/2022 12:10 PM  Total GAD-7 Score 2 4 9 9 5       PHQ2-9    Flowsheet Row Office Visit from 02/27/2023 in [facility] Most recent reading at 02/27/2023  1:16 PM Office Visit from 12/14/2022 in [facility] Most recent reading at 12/14/2022 11:55 AM Office Visit from 09/21/2022 in [facility] Most recent reading at 09/21/2022 11:50 AM Clinical Support from 09/21/2022 in [facility] Most recent reading at 09/21/2022 11:49 AM Clinical Support from 06/05/2022 in [facility] Most recent reading at 06/05/2022 12:12 PM  PHQ-2 Total Score 2 2 5 5  0  PHQ-9 Total Score 4 6 19 19 11       Flowsheet Row Office Visit from 02/27/2023 in [facility] Office Visit from 12/14/2022 in [facility] Office Visit from 09/21/2022 in [facility]  C-SSRS RISK CATEGORY Error: Q3, 4, or 5 should not be populated when Q2 is No No Risk No Risk       Augusta Blizzard, MD 06/01/2023, 8:20 AM

## 2023-05-31 NOTE — Progress Notes (Cosign Needed)
  PATIENT ARRIVED FOR INJECTION -- BY [facility] MA    ARIPiprazole ER (ABILIFY MAINTENA) 400 MG  TOLERATED WELL IN RIGHT GLUTEAL NO HI/SI NOR  AH/VH STATED HE'S DOING WELL

## 2023-06-01 ENCOUNTER — Telehealth (HOSPITAL_COMMUNITY): Payer: Self-pay | Admitting: Student

## 2023-06-01 NOTE — Telephone Encounter (Signed)
 Spoke with Dr. Lazoff (PCP) regarding ongoing adderall dosage for ADHD. Discussed how much he states he is taking and how he may benefit from going down on the medication which patient was amenable to. Patient came as a transfer to PCP and in an effort not to disrupt his mental health, PCP continued adderall at dose he had be transferred on. PCP will call patient to verify and will go down to 20 mg bid. Patient will likely benefit from further tapering and/or switching to adderall XR to decrease pill burden as well as reduce tachycardia and hypertension.  Augusta Blizzard, MD Psychiatry Resident, PGY3

## 2023-06-28 ENCOUNTER — Ambulatory Visit (HOSPITAL_COMMUNITY): Payer: MEDICAID

## 2023-07-24 NOTE — Progress Notes (Deleted)
 BH MD Outpatient Progress Note  07/24/2023 6:56 PM [name]  MRN:  [id number]  Assessment:  Dale Calderon is a 43 y.o. male with a history of substance induced mood disorder, schizoaffective disorder bipolar type, ADHD, anxiety, polysubstance use, insomnia who is an established patient with [facility] for [facility] clinic. He is prescribed Adderall 30 mg twice a day with an additional 10 mg as needed for ADHD. At this point, I am not certain whether patient's psychotic symptoms are secondary to substances or primary thought disorder. I will reach out to patient's PCP to discuss decreasing his adderall dosage given it is likely putting him at higher risk of psychosis. He was somewhat hyperverbal during the conversation.   Today, 07/24/2023, he was amenable to decreasing adderall dosage given he does not require as high of a dose anymore. He also reports to only taking 20 mg in AM and 20 mg in PM with 10 mg prn. Left message with patient's PCP to discuss tapering his medication or transitioning him to adderall xr as to reduce pill burden.   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    Patient and plan of care will be discussed with the Attending MD, Dr. Eligio Grumbling, who agrees with the above statement and plan.   Subjective:  Chief Complaint: Medication Management   Interval History:  Patient seen for [facility] clinic.  Denies any acute somatic complaints at this time besides a left shoulder pinched nerve.  Patient reports eating and sleeping well.  Patient reports struggling with some anxiety and mild depression given he has not worked in several years and feels unproductive playing video games all day.  Patient is currently still awaiting disability.  He denies present SI/HI/AVH.  Patient denies paranoia or hyperreligiosity.  Patient reports that his last experience of this was several years ago when he was hospitalized where he thought that he was literally covered in demons and required a religious cleansing of some sort.  We discussed concerns regarding having such a high dose of Adderall which may be contributing to his anxiety as well that put him at risk of decompensation.  Visit Diagnosis:  No diagnosis found.   Past Psychiatric History:  Diagnoses: Schizoaffective disorder, bipolar type, substance-induced psychosis, substance-induced mood disorder, cocaine abuse, opiate use disorder Previous psychiatrist/therapist: [facility] Hospitalizations: 1  Past Medical History:  Past Medical History:  Diagnosis Date  . ADHD (attention deficit hyperactivity disorder)     No past surgical history on file.   Family History:  No family history on file.  Social History:  Academic/Vocational: unemployed, seeking disability Social History   Socioeconomic History  . Marital status: Single    Spouse name: Not on file  .  Number of children: Not on file  . Years of education: Not on file  . Highest education level: Not on file  Occupational History  . Not on file  Tobacco Use  . Smoking status: Former    Types: Cigarettes  . Smokeless  tobacco: Never  Vaping Use  . Vaping status: Every Day  . Substances: Nicotine  Substance and Sexual Activity  . Alcohol use: Yes    Comment: occ  . Drug use: Yes    Types: Marijuana  . Sexual activity: Not on file  Other Topics Concern  . Not on file  Social History Narrative   ** Merged History Encounter **       Social Drivers of Health   Financial Resource Strain: Not on file  Food Insecurity: Low Risk  (02/13/2023)   Received from [facility]   Hunger Vital Sign   . Within the past 12 months, you worried that your food would run out before you got money to buy more: Never true   . Within the past 12 months, the food you bought just didn't last and you didn't have money to get more. : Never true  Transportation Needs: No Transportation Needs (02/13/2023)   Received from [facility]   . In the past 12 months, has lack of reliable transportation kept you from medical appointments, meetings, work or from getting things needed for daily living? : No  Physical Activity: Not on file  Stress: Not on file  Social Connections: Not on file    Allergies:  No Known Allergies  Current Medications: Current Outpatient Medications  Medication Sig Dispense Refill  . albuterol  (PROVENTIL  HFA;VENTOLIN  HFA) 108 (90 Base) MCG/ACT inhaler Inhale 2 puffs into the lungs every 6 (six) hours as needed for wheezing or shortness of breath.    . amphetamine-dextroamphetamine (ADDERALL) 20 MG tablet Take 20 mg by mouth. Take 1.5 tablets (30 mg total) by mouth 2 times daily. May take another 1/2 tablet per day if needed.    . ARIPiprazole  ER (ABILIFY  MAINTENA) 400 MG SRER injection Inject 2 mLs (400 mg total) into the muscle every 28 (twenty-eight) days. Max 1 injection per 28 days dispense brand name only 1 each 11  . cyclobenzaprine  (FLEXERIL ) 10 MG tablet Take 1 tablet (10 mg total) by mouth 2 (two) times daily as needed for muscle spasms. 20 tablet 0  . lidocaine  (LIDODERM ) 5 %  Place 1 patch onto the skin daily. Remove & Discard patch within 12 hours or as directed by MD 15 patch 0  . predniSONE  (DELTASONE ) 20 MG tablet 3 Tabs PO Days 1-3, then 2 tabs PO Days 4-6, then 1 tab PO Day 7-9, then Half Tab PO Day 10-12 20 tablet 0   No current facility-administered medications for this visit.    ROS: Review of Systems   Objective:  Psychiatric Specialty Exam: There were no vitals taken for this visit.There is no height or weight on file to calculate BMI.  General Appearance: Casual  Eye Contact:  Fair  Speech:  Pressured  Volume:  Increased  Mood:  Euphoric  Affect:  Appropriate and Congruent  Thought Content: Logical   Suicidal Thoughts:  No  Homicidal Thoughts:  No  Thought Process:  Coherent, Goal Directed, and Linear  Orientation:  Full (Time, Place, and Person)  Judgment:  Intact  Insight:  Fair  Concentration:  Concentration: Fair  Fund of Knowledge: Fair  Language: Fair  Psychomotor Activity:  Increased  Akathisia:  No  AIMS (if indicated): done, 0  Assets:  Communication Skills Desire for Improvement Financial Resources/Insurance Housing Leisure Time Physical Health Resilience  ADL's:  Intact  Cognition: WNL    PE: General: well-appearing; no acute distress  Pulm: no increased work of breathing on room air  Strength & Muscle Tone: within normal limits Neuro: no focal neurological deficits observed  Gait & Station: normal    Screenings: AIMS    Flowsheet Row Office Visit from 02/27/2023 in [facility] Most recent reading at 02/27/2023  1:17 PM Office Visit from 09/21/2022 in [facility] Most recent reading at 09/21/2022 11:51 AM Clinical Support from 09/21/2022 in [facility] Most recent reading at 09/21/2022 11:47 AM Clinical Support from 06/05/2022 in [facility] Most recent reading at 06/05/2022  3:31 PM Office Visit from  04/28/2021 in [facility] Most recent reading at 04/28/2021  3:46 PM  AIMS Total Score 0 0 0 0 0   GAD-7    Flowsheet Row Office Visit from 02/27/2023 in [facility] Most recent reading at 02/27/2023  1:16 PM Office Visit from 12/14/2022 in [facility] Most recent reading at 12/14/2022 11:55 AM Office Visit from 09/21/2022 in [facility] Most recent reading at 09/21/2022 11:51 AM Clinical Support from 09/21/2022 in [facility] Most recent reading at 09/21/2022 11:48 AM Clinical Support from 06/05/2022 in [facility] Most recent reading at 06/05/2022 12:10 PM  Total GAD-7 Score 2 4 9 9 5    PHQ2-9    Flowsheet Row Office Visit from 02/27/2023 in [facility] Most recent reading at 02/27/2023  1:16 PM Office Visit from 12/14/2022 in [facility] Most recent reading at 12/14/2022 11:55 AM Office Visit from 09/21/2022 in [facility] Most recent reading at 09/21/2022 11:50 AM Clinical Support from 09/21/2022 in [facility] Most recent reading at 09/21/2022 11:49 AM Clinical Support from 06/05/2022 in [facility] Most recent reading at 06/05/2022 12:12 PM  PHQ-2 Total Score 2 2 5 5  0  PHQ-9 Total Score 4 6 19 19 11    Flowsheet Row Office Visit from 02/27/2023 in [facility] Office Visit from 12/14/2022 in [facility] Office Visit from 09/21/2022 in [facility]  C-SSRS RISK CATEGORY Error: Q3, 4, or 5 should not be populated when Q2 is No No Risk No Risk    Augusta Blizzard, MD 07/24/2023, 6:56 PM

## 2023-07-25 ENCOUNTER — Ambulatory Visit (HOSPITAL_COMMUNITY): Payer: MEDICAID

## 2023-07-31 ENCOUNTER — Telehealth (HOSPITAL_COMMUNITY): Payer: Self-pay | Admitting: *Deleted

## 2023-07-31 NOTE — Telephone Encounter (Signed)
 No showed for his appt with his provider and his shot on 07/25/23. No answer on patients number so I called his father, Christopher. Christopher states he is doing OK but doesn't like some aspects of his shot. He will get up with his son and ask him to reschedule or come in on a shot clinic day.

## 2023-08-22 ENCOUNTER — Telehealth (HOSPITAL_COMMUNITY): Payer: Self-pay

## 2023-08-22 NOTE — Telephone Encounter (Signed)
 Called this patient to get him back on the schedule in the clinic, phone # ending 1962 is not a working # and the # ending [phone] patient did not answer left voicemail for patient to give a call back. Thank you!

## 2023-10-01 ENCOUNTER — Emergency Department (HOSPITAL_COMMUNITY)
Admission: EM | Admit: 2023-10-01 | Discharge: 2023-10-02 | Disposition: A | Discharge status: Other Acute Inpatient Hospital | Payer: MEDICAID | Attending: Emergency Medicine | Admitting: Emergency Medicine

## 2023-10-01 DIAGNOSIS — F259 Schizoaffective disorder, unspecified: Secondary | ICD-10-CM

## 2023-10-01 DIAGNOSIS — G4700 Insomnia, unspecified: Secondary | ICD-10-CM | POA: Diagnosis not present

## 2023-10-01 DIAGNOSIS — F250 Schizoaffective disorder, bipolar type: Secondary | ICD-10-CM | POA: Diagnosis not present

## 2023-10-01 DIAGNOSIS — Z87891 Personal history of nicotine dependence: Secondary | ICD-10-CM | POA: Diagnosis not present

## 2023-10-01 DIAGNOSIS — R000 Tachycardia, unspecified: Secondary | ICD-10-CM | POA: Insufficient documentation

## 2023-10-01 DIAGNOSIS — F309 Manic episode, unspecified: Secondary | ICD-10-CM | POA: Diagnosis present

## 2023-10-01 LAB — COMPREHENSIVE METABOLIC PANEL WITH GFR
ALT: 47 U/L — ABNORMAL HIGH (ref 0–44)
AST: 45 U/L — ABNORMAL HIGH (ref 15–41)
Albumin: 3.5 g/dL (ref 3.5–5.0)
Alkaline Phosphatase: 71 U/L (ref 38–126)
Anion gap: 11 (ref 5–15)
BUN: 17 mg/dL (ref 6–20)
CO2: 22 mmol/L (ref 22–32)
Calcium: 9 mg/dL (ref 8.9–10.3)
Chloride: 105 mmol/L (ref 98–111)
Creatinine, Ser: 1.24 mg/dL (ref 0.61–1.24)
GFR, Estimated: >60 mL/min (ref >60)
Glucose, Bld: 171 mg/dL — ABNORMAL HIGH (ref 70–99)
Potassium: 3.3 mmol/L — ABNORMAL LOW (ref 3.5–5.1)
Sodium: 138 mmol/L (ref 135–145)
Total Bilirubin: 1.3 mg/dL — ABNORMAL HIGH (ref 0.0–1.2)
Total Protein: 6.7 g/dL (ref 6.5–8.1)

## 2023-10-01 LAB — CBC
HCT: 44.4 % (ref 39.0–52.0)
Hemoglobin: 15.7 g/dL (ref 13.0–17.0)
MCH: 30.5 pg (ref 26.0–34.0)
MCHC: 35.4 g/dL (ref 30.0–36.0)
MCV: 86.4 fL (ref 80.0–100.0)
Platelets: 248 K/uL (ref 150–400)
RBC: 5.14 MIL/uL (ref 4.22–5.81)
RDW: 11.8 % (ref 11.5–15.5)
WBC: 7.5 K/uL (ref 4.0–10.5)
nRBC: 0 % (ref 0.0–0.2)

## 2023-10-01 LAB — RAPID URINE DRUG SCREEN, HOSP PERFORMED
Amphetamines: POSITIVE — AB
Barbiturates: NOT DETECTED
Benzodiazepines: NOT DETECTED
Cocaine: NOT DETECTED
Opiates: NOT DETECTED
Tetrahydrocannabinol: POSITIVE — AB

## 2023-10-01 LAB — ETHANOL: Alcohol, Ethyl (B): <15 mg/dL (ref <15)

## 2023-10-01 MED ORDER — NICOTINE 21 MG/24HR TD PT24
21.0000 mg | MEDICATED_PATCH | Freq: Every day | TRANSDERMAL | Status: DC
Start: 2023-10-01 — End: 2023-10-02
  Administered 2023-10-01: 21 mg via TRANSDERMAL
  Filled 2023-10-01 (×2): qty 1

## 2023-10-01 MED ORDER — POTASSIUM CHLORIDE CRYS ER 20 MEQ PO TBCR
40.0000 meq | EXTENDED_RELEASE_TABLET | Freq: Once | ORAL | Status: AC
  Administered 2023-10-01: 40 meq via ORAL
  Filled 2023-10-01: qty 2

## 2023-10-01 MED ORDER — TRAZODONE HCL 50 MG PO TABS
50.0000 mg | ORAL_TABLET | Freq: Every day | ORAL | Status: DC
  Filled 2023-10-01: qty 1

## 2023-10-01 MED ORDER — ARIPIPRAZOLE 5 MG PO TABS
5.0000 mg | ORAL_TABLET | Freq: Every day | ORAL | Status: DC
  Administered 2023-10-02 (×2): 5 mg via ORAL
  Filled 2023-10-01 (×3): qty 1

## 2023-10-01 NOTE — ED Provider Notes (Addendum)
 [place] EMERGENCY DEPARTMENT AT [facility] Provider Note  CSN: [id number] Arrival date & time: 10/01/23 1438  Chief Complaint(s) Psychiatric Evaluation (IVCd)  HPI Dale Calderon is a 43 y.o. male here today after he was placed under an IVC by crisis counselor with [employer].  Patient with history of schizophrenia, has been noncompliant with his medications.  On assessment, patient responding to internal stimuli, exhibiting features consistent with mania.  Reportedly supposed to be on Abilify  Maintena injection.  Looks as though he last received this several months ago.   Past Medical History Past Medical History:  Diagnosis Date   ADHD (attention deficit hyperactivity disorder)    Patient Active Problem List   Diagnosis Date Noted   Generalized anxiety disorder 06/05/2022   Insomnia 06/05/2022   Schizophrenia, paranoid (HCC) 06/05/2022   Schizoaffective disorder, bipolar type (HCC) 07/10/2019   Attention deficit hyperactivity disorder (ADHD), predominantly inattentive type 07/10/2019   Polysubstance abuse (HCC) in the past 07/10/2019   Involuntary commitment    Psychosis (HCC)    Substance induced mood disorder (HCC) 09/29/2016   Home Medication(s) Prior to Admission medications   Medication Sig Start Date End Date Taking? Authorizing Provider  albuterol  (PROVENTIL  HFA;VENTOLIN  HFA) 108 (90 Base) MCG/ACT inhaler Inhale 2 puffs into the lungs every 6 (six) hours as needed for wheezing or shortness of breath.   Yes [provider]  amphetamine-dextroamphetamine (ADDERALL) 20 MG tablet Take 20 mg by mouth. Take 1.5 tablets (30 mg total) by mouth 2 times daily. May take another 1/2 tablet per day if needed. Patient not taking: Reported on 10/01/2023 03/10/22   [provider]  ARIPiprazole  ER (ABILIFY  MAINTENA) 400 MG SRER injection Inject 2 mLs (400 mg total) into the muscle every 28 (twenty-eight) days. Max 1 injection per 28 days  dispense brand name only Patient not taking: Reported on 10/01/2023 02/27/23   Harl Zane BRAVO, NP  cyclobenzaprine  (FLEXERIL ) 10 MG tablet Take 1 tablet (10 mg total) by mouth 2 (two) times daily as needed for muscle spasms. Patient not taking: Reported on 10/01/2023 04/06/22   Tegeler, Lonni PARAS, MD  lidocaine  (LIDODERM ) 5 % Place 1 patch onto the skin daily. Remove & Discard patch within 12 hours or as directed by MD Patient not taking: Reported on 10/01/2023 04/06/22   Tegeler, Lonni PARAS, MD  predniSONE  (DELTASONE ) 20 MG tablet 3 Tabs PO Days 1-3, then 2 tabs PO Days 4-6, then 1 tab PO Day 7-9, then Half Tab PO Day 10-12 Patient not taking: Reported on 10/01/2023 03/27/22   Desiderio Chew, PA-C                                                                                                                                    Past Surgical History No past surgical history on file. Family History No family history on file.  Social History Social History   Tobacco Use  Smoking status: Former    Types: Cigarettes   Smokeless tobacco: Never  Vaping Use   Vaping status: Every Day   Substances: Nicotine   Substance Use Topics   Alcohol use: Yes    Comment: occ   Drug use: Yes    Types: Marijuana   Allergies Patient has no known allergies.  Review of Systems Review of Systems  Physical Exam Vital Signs  I have reviewed the triage vital signs BP (!) 147/101 (BP Location: Right Arm)   Pulse (!) 117   Temp 98.3 F (36.8 C) (Oral)   Resp 16   SpO2 98%   Physical Exam Vitals and nursing note reviewed.  Constitutional:      Appearance: He is not toxic-appearing.  Cardiovascular:     Rate and Rhythm: Tachycardia present.  Pulmonary:     Effort: Pulmonary effort is normal.  Abdominal:     General: Abdomen is flat.  Musculoskeletal:        General: No swelling. Normal range of motion.     Cervical back: Normal range of motion.  Neurological:     Mental Status: He is  alert.     Gait: Gait normal.     Comments: No slurred speech  Psychiatric:     Comments: Patient replying to internal stimuli, pacing in the room, muttering to himself.       ED Results and Treatments Labs (all labs ordered are listed, but only abnormal results are displayed) Labs Reviewed  COMPREHENSIVE METABOLIC PANEL WITH GFR - Abnormal; Notable for the following components:      Result Value   Potassium 3.3 (*)    Glucose, Bld 171 (*)    AST 45 (*)    ALT 47 (*)    Total Bilirubin 1.3 (*)    All other components within normal limits  RAPID URINE DRUG SCREEN, HOSP PERFORMED - Abnormal; Notable for the following components:   Amphetamines POSITIVE (*)    Tetrahydrocannabinol POSITIVE (*)    All other components within normal limits  ETHANOL  CBC                                                                                                                          Radiology No results found.  Pertinent labs & imaging results that were available during my care of the patient were reviewed by me and considered in my medical decision making (see MDM for details).  Medications Ordered in ED Medications  potassium chloride  SA (KLOR-CON  M) CR tablet 40 mEq (has no administration in time range)  Procedures Procedures  (including critical care time)  Medical Decision Making / ED Course   This patient presents to the ED for concern of psychosis, this involves an extensive number of treatment options, and is a complaint that carries with it a high risk of complications and morbidity.  The differential diagnosis includes psychosis, substance use, less likely encephalopathy, less likely infectious.  MDM: With the patient psychiatric history, presenting symptoms, this does appear to be decompensated psychosis.  I am agreeing with the IVC that was  filed externally and I filled out the first examination forms.  Patient will require TTS referral minimal certainly inpatient hospitalization.  Lab work ordered, home medications ordered.  Reassessment 4:10 PM-potassium 3.3, oral potassium ordered.  Urine positive for THC and amphetamines.  He is prescribed Adderall.  Medically cleared.   Additional history obtained: -Additional history obtained from parents were a police -External records from outside source obtained and reviewed including: Chart review including previous notes, labs, imaging, consultation notes   Lab Tests: -I ordered, reviewed, and interpreted labs.   The pertinent results include:   Labs Reviewed  COMPREHENSIVE METABOLIC PANEL WITH GFR - Abnormal; Notable for the following components:      Result Value   Potassium 3.3 (*)    Glucose, Bld 171 (*)    AST 45 (*)    ALT 47 (*)    Total Bilirubin 1.3 (*)    All other components within normal limits  RAPID URINE DRUG SCREEN, HOSP PERFORMED - Abnormal; Notable for the following components:   Amphetamines POSITIVE (*)    Tetrahydrocannabinol POSITIVE (*)    All other components within normal limits  ETHANOL  CBC      Medicines ordered and prescription drug management: Meds ordered this encounter  Medications   potassium chloride  SA (KLOR-CON  M) CR tablet 40 mEq    -I have reviewed the patients home medicines and have made adjustments as needed    Cardiac Monitoring: The patient was maintained on a cardiac monitor.  I personally viewed and interpreted the cardiac monitored which showed an underlying rhythm of: Normal sinus rhythm  Social Determinants of Health:  Factors impacting patients care include: Lack of access to primary care   Reevaluation: After the interventions noted above, I reevaluated the patient and found that they have :improved  Co morbidities that complicate the patient evaluation  Past Medical History:  Diagnosis Date   ADHD  (attention deficit hyperactivity disorder)         Final Clinical Impression(s) / ED Diagnoses Final diagnoses:  Schizoaffective disorder, unspecified type (HCC)     @PCDICTATION @    Mannie Pac T, DO 10/01/23 1611    Mannie Pac T, DO 10/01/23 1611

## 2023-10-01 NOTE — ED Notes (Signed)
 Patient manic. Labile with emotions. Patient intrusive at times, redirectable. Patient hyper religious.

## 2023-10-01 NOTE — Progress Notes (Addendum)
 Pt has been accepted to [facility] on 10/01/2023 . Unit  assignment:2W  Pt meets inpatient criteria per Bernadette Barefoot, NP   Attending Physician will be Dr. Prentice Balloon   Report can be called to: - [phone]   Pt can arrive after 8AM   Care Team Notified: Beryl Sprang, RN, and Bernadette Barefoot, NP

## 2023-10-01 NOTE — Consult Note (Signed)
 Patient evaluated by psychiatry, demonstrates flight of ideas, is hyper religious and believes he has a divine purpose that will not allow him to sleep.  He is also cooperative and redirectable.    Patient referred for psychiatric admission.  Complete consult to follow,currently pending.

## 2023-10-01 NOTE — ED Triage Notes (Signed)
 Patient to [facility] by [employer]. Patient under IVC by Crisis Counselor.   In Manic state, non medication compliant.  Hx of Schizophrenia

## 2023-10-01 NOTE — ED Notes (Addendum)
 Pt has been accepted to [facility] on 10/01/2023 . Unit  assignment:2W   Pt meets inpatient criteria per Bernadette Barefoot, NP    Attending Physician will be Dr. Prentice Balloon    Report can be called to: - [phone]    Pt can arrive after 8AM    Care Team Notified: Beryl Sprang, RN, and Bernadette Barefoot, NP   [facility]  under the care of   Dr Balloon. Report can be called to [phone] and pt can   arrive after 0800 on 10/02/2023. [facility] notified.

## 2023-10-01 NOTE — ED Notes (Signed)
 Patient wanded by security.

## 2023-10-01 NOTE — Progress Notes (Signed)
 Inpatient Psychiatric Referral  Patient was recommended inpatient per Bernadette Barefoot, NP. There are no available beds at [facility], per [facility]. Patient was referred to the following out of network facilities:  Destination  Service Provider Request Status Address Phone Fax  [facility]  Pending - Request Sent [address], [place] [address] [phone]  [facility]  Pending - Request Sent [address] [place] [address] [phone]  [facility]  Pending - Request Sent [address] [place] [address] [phone]  [facility]  Pending - Request Sent [address] [place] [address] [phone]  [facility]  Pending - Request Sent [address] [place] [address] [phone]  [facility]  Pending - Request Sent [address], [place] [address] [phone]  [facility]  Pending - Request Sent [address] [place] [phone]  [facility]  Pending - Request Sent [address] [place] [address] [phone]  [facility]  Pending - Request Sent [address] [place] [address] [phone]    Situation ongoing, CSW to continue following and update chart as more information becomes available.  Harrie Sofia MSW, LCSWA 10/01/2023  8:13 PM

## 2023-10-01 NOTE — Consult Note (Signed)
 [facility] Psychiatric Consult Initial  Patient Name: .Dale Calderon  MRN: [id number]  DOB: 23-Jun-1980  Consult Order details:  Orders (From admission, onward)     Start     Ordered   10/01/23 1511  CONSULT TO CALL ACT TEAM       Ordering Provider: Mannie Fairy DASEN, DO  Provider:  (Not yet assigned)  Question:  Reason for Consult?  Answer:  Mania   10/01/23 1510             Mode of Visit: Tele-visit Virtual Statement:TELE PSYCHIATRY ATTESTATION & CONSENT As the provider for this telehealth consult, I attest that I verified the patient's identity using two separate identifiers, introduced myself to the patient, provided my credentials, disclosed my location, and performed this encounter via a HIPAA-compliant, real-time, face-to-face, two-way, interactive audio and video platform and with the full consent and agreement of the patient (or guardian as applicable.) Patient physical location: [facility]. Telehealth provider physical location: home office in state of [place].   Video start time: 1630 Video end time: 1715    Psychiatry Consult Evaluation  Service Date: October 01, 2023 LOS:  LOS: 0 days  Chief Complaint I feel great, the spirit sent me here.  Primary Psychiatric Diagnoses  Schizoaffective Disorder, Bipolar Type 2.  Insomnia  Assessment  Dale Calderon is a 43 y.o. male admitted: Presented to the EDfor 10/01/2023  2:41 PM for  Per ED Provider Admission Assessment 10/01/2023@1438 : Dale Calderon is a 43 y.o. male here today after he was placed under an IVC by crisis counselor with [employer].  Patient with history of schizophrenia, has been noncompliant with his medications.  On assessment, patient responding to internal stimuli, exhibiting features consistent with mania.  Reportedly supposed to be on Abilify  Maintena injection.  Looks as though he last received this several months ago.   He carries the psychiatric diagnoses of paranoid schizophrenia,  polysubstance abuse, substance induced mood disorder, schizoaffective bipolar type, ADHD.  There is no medical hx listed.   His current presentation of flight of ideas, hyper religiosity, pressured speech and audible hallucinations (denies command) is most consistent with his dx for schizoaffective disorder, bipolar type.  He meets criteria for psychiatric admission based on above.  Current outpatient psychotropic medications include aripiprazole  ER 400mg  ER and historically he has had a good response to these medications. He was non- compliant with medications prior to admission as evidenced by patient reports he stopped taking medication several months ago, after hearing from God who healed him and told him to stop medications.  At present, patient lacks good decision making capacity, is very impulsive and needs to be in a controlled environment for safety.  Admission was discussed with patient who agrees he needs to get some sleep;    On initial examination, patient is alert and oriented to current situation; he is cooperative but has pressured speech and hyper religious thoughts.  Please see plan below for detailed recommendations.   Diagnoses:  Active Hospital problems: Principal Problem:   Schizoaffective disorder, bipolar type (HCC) Active Problems:   Insomnia    Plan   ## Psychiatric Medication Recommendations:  Plan to start aripiprazole  5mg  po daily for psychosis; once tolerability proven, plan to restart Aripiprazole  LAI, Q 28 days for mood stability -Trazodone  50mg  po at bedtime for sleep/insomnia -Nicotine  Patch 21mg  po daily for smoking cessation  ## Medical Decision Making Capacity: Not specifically addressed in this encounter  ## Further Work-up:  --  Deferred to ED Provider-recommend K+ replacement  While pt on Qtc prolonging medications, please monitor & replete K+ to 4 and Mg2+ to 2 -- most recent EKG on 10/01/2023 had QtC of 418 -- Pertinent labwork reviewed earlier this  admission includes: CMP, CBC, UDS, EKG   ## Disposition:-- We recommend inpatient psychiatric hospitalization when medically cleared. Patient is under voluntary admission status at this time; please IVC if attempts to leave hospital.  ## Behavioral / Environmental: -Utilize compassion and acknowledge the patient's experiences while setting clear and realistic expectations for care.    ## Safety and Observation Level:  - Based on my clinical evaluation, I estimate the patient to be at low risk of self harm in the current setting. - At this time, we recommend  routine. This decision is based on my review of the chart including patient's history and current presentation, interview of the patient, mental status examination, and consideration of suicide risk including evaluating suicidal ideation, plan, intent, suicidal or self-harm behaviors, risk factors, and protective factors. This judgment is based on our ability to directly address suicide risk, implement suicide prevention strategies, and develop a safety plan while the patient is in the clinical setting. Please contact our team if there is a concern that risk level has changed.  CSSR Risk Category:C-SSRS RISK CATEGORY: No Risk  Suicide Risk Assessment: Patient has following modifiable risk factors for suicide: medication noncompliance and active mental illness (to encompass adhd, tbi, mania, psychosis, trauma reaction), which we are addressing by restarting home medications and referring for psychiatric admission for safety and mood stability monitoring. Patient has following non-modifiable or demographic risk factors for suicide: male gender and psychiatric hospitalization Patient has the following protective factors against suicide: Supportive family and Cultural, spiritual, or religious beliefs that discourage suicide  Thank you for this consult request. Recommendations have been communicated to the primary team.  We will sign off at this  time.   Dale FORBES Barefoot, NP       History of Present Illness  Relevant Aspects of Hospital ED Course:  Admitted on 10/01/2023 for Per RN Triage Note 10/01/2023@1503 : [facility] to [facility] by [facility]. Patient under IVC by Crisis Counselor. In Manic state, non medication compliant. Hx of Schizophrenia   Patient Report:  Patient greeted by this writer and given anticipatory guidance; he is alert and oriented to person place and time, current situation.  He's observed seated on the bed, is fairly groomed, wearing hospital scrubs.  He agrees to continue assessment.    When asked what brought him to the hospital he reports his spirit brought him here on October 05, 2023.  He states he's not the creator but he's here to introduce us  to the creator, Jesus.   His speech is pressured but he accepts redirection and does slow down when requested by this Clinical research associate.   He reports he was dx with bipolar disorder, used to take abilify  but no longer takes it because he was healed from the creator and no longer needs the medication.  He reports 3-4 months was the last time he took LAI.  Historically, reports he tolerated the medication well without concerns. He reports he's been talking with provider on line, they've been giving me visions and thoughts. Patient is mentally decompensated but cooperative.   He reports he's slept about 10 hours within the past few days.  He states the power of God and his heavenly father talking to him, makes it difficut to sleep.  Patient is hyper  religious but does agree that he needs sleep to continue his divine mission. He states he cannot recall the last time he ate but denies being hungry. He currently denies suicidal or homicidal ideations;  He reports vaping nicotine  daily; He has a hx for polysubstance abuse, currently denies use.   Psych ROS:  Depression: pt denies Anxiety:  yes Mania (lifetime and current): current Psychosis: (lifetime and current): current, denies command  hallucinations  Collateral information:  Deferred for now  Review of Systems  Constitutional: Negative.   HENT: Negative.    Skin: Negative.      Psychiatric and Social History  Psychiatric History:  Information collected from Patient and chart review  Prev Dx/Sx: paranoid schizophrenia, polysubstance abuse, substance induced mood disorder, schizoaffective bipolar type, ADHD Current Psych Provider: denies Home Meds (current): aripiprazole  ER, abilify  mantena 400mg  LAI q 28 days.  Previous Med Trials: aripiprazole , amphetamine, dextroamphetamine  Therapy: pt denies  Prior Psych Hospitalization: yes  Prior Self Harm: pt denies Prior Violence: pt denies  Family Psych History: pt unable to provide details d/t  current mental state/decline Family Hx suicide: unknown  Social History:  Developmental Hx: he denies Educational Hx: 9th grade education Occupational Hx: he reports his father, Christopher pays his bills  Legal Hx: denies Living Situation: he is single and lives home alone Spiritual Hx: Christian   Access to weapons/lethal means: denies, brother died from a gun when he was 48 years old.    Substance History Alcohol: denies  Tobacco: he used nicotine  vape pen Illicit drugs: Marijuana is the only stimulant you can use that the demons will not attack.  Prescription drug abuse: denies Rehab hx: hx of heroine addiction, and suboxone use- stopped in 2021  Exam Findings  Physical Exam:  Vital Signs:  Temp:  [98.3 F (36.8 C)] 98.3 F (36.8 C) (08/25 1455) Pulse Rate:  [117] 117 (08/25 1455) Resp:  [16] 16 (08/25 1455) BP: (147)/(101) 147/101 (08/25 1455) SpO2:  [98 %] 98 % (08/25 1455) Blood pressure (!) 147/101, pulse (!) 117, temperature 98.3 F (36.8 C), temperature source Oral, resp. rate 16, SpO2 98%. There is no height or weight on file to calculate BMI.  Physical Exam  Mental Status Exam: General Appearance: Bizarre and Fairly Groomed  Orientation:   Full (Time, Place, and Person)  Memory:  Immediate;   Fair Recent;   Fair Remote;   Fair  Concentration:  Concentration: Poor and Attention Span: Poor  Recall:  Fair  Attention  Poor  Eye Contact:  Good  Speech:  Pressured  Language:  Good  Volume:  Increased  Mood:  I feel great  Affect:  Blunt and Inappropriate  Thought Process:  Disorganized and Descriptions of Associations: Circumstantial  Thought Content:  Illogical, Hallucinations: Auditory, and Rumination  Suicidal Thoughts:  No  Homicidal Thoughts:  No  Judgement:  Impaired  Insight:  Lacking  Psychomotor Activity:  Increased  Akathisia:  No  Fund of Knowledge:  Fair      Assets:  Communication Skills Desire for Improvement Housing Social Support  Cognition:  Impaired,  Mild  ADL's:  Intact  AIMS (if indicated):        Other History   These have been pulled in through the EMR, reviewed, and updated if appropriate.  Family History:  The patient's family history is not on file.  Medical History: Past Medical History:  Diagnosis Date  . ADHD (attention deficit hyperactivity disorder)     Surgical History: No past  surgical history on file.   Medications:   Current Facility-Administered Medications:  .  ARIPiprazole  (ABILIFY ) tablet 5 mg, 5 mg, Oral, Daily, Moishe Burton E, NP .  traZODone  (DESYREL ) tablet 50 mg, 50 mg, Oral, QHS, Arnella Pralle E, NP  Current Outpatient Medications:  .  albuterol  (PROVENTIL  HFA;VENTOLIN  HFA) 108 (90 Base) MCG/ACT inhaler, Inhale 2 puffs into the lungs every 6 (six) hours as needed for wheezing or shortness of breath., Disp: , Rfl:  .  amphetamine-dextroamphetamine (ADDERALL) 20 MG tablet, Take 20 mg by mouth. Take 1.5 tablets (30 mg total) by mouth 2 times daily. May take another 1/2 tablet per day if needed. (Patient not taking: Reported on 10/01/2023), Disp: , Rfl:  .  ARIPiprazole  ER (ABILIFY  MAINTENA) 400 MG SRER injection, Inject 2 mLs (400 mg total) into the muscle  every 28 (twenty-eight) days. Max 1 injection per 28 days dispense brand name only (Patient not taking: Reported on 10/01/2023), Disp: 1 each, Rfl: 11 .  cyclobenzaprine  (FLEXERIL ) 10 MG tablet, Take 1 tablet (10 mg total) by mouth 2 (two) times daily as needed for muscle spasms. (Patient not taking: Reported on 10/01/2023), Disp: 20 tablet, Rfl: 0 .  lidocaine  (LIDODERM ) 5 %, Place 1 patch onto the skin daily. Remove & Discard patch within 12 hours or as directed by MD (Patient not taking: Reported on 10/01/2023), Disp: 15 patch, Rfl: 0 .  predniSONE  (DELTASONE ) 20 MG tablet, 3 Tabs PO Days 1-3, then 2 tabs PO Days 4-6, then 1 tab PO Day 7-9, then Half Tab PO Day 10-12 (Patient not taking: Reported on 10/01/2023), Disp: 20 tablet, Rfl: 0  Allergies: No Known Allergies  Burton FORBES Moishe, NP

## 2023-10-02 ENCOUNTER — Telehealth (HOSPITAL_COMMUNITY): Payer: Self-pay | Admitting: Student

## 2023-10-02 MED ORDER — LORAZEPAM 1 MG PO TABS: 1.0000 mg | ORAL_TABLET | Freq: Two times a day (BID) | ORAL | Status: DC

## 2023-10-02 MED ORDER — ZIPRASIDONE MESYLATE 20 MG IM SOLR: 20.0000 mg | Freq: Four times a day (QID) | INTRAMUSCULAR | Status: DC | PRN

## 2023-10-02 MED ORDER — LORAZEPAM 1 MG PO TABS
1.0000 mg | ORAL_TABLET | Freq: Two times a day (BID) | ORAL | Status: DC | PRN
  Administered 2023-10-02: 1 mg via ORAL
  Filled 2023-10-02: qty 1

## 2023-10-02 NOTE — Telephone Encounter (Signed)
 Patient last seen by me in April 2025. He has missed his last 3 Abilify  Maintena 400 mg injections. Primary diagnosis at that point was schizoaffective disorder, bipolar type. Current hospitalization appears consistent with decompensation due to medication nonadherence. He will need psychiatric hospitalization for acute stabilization.

## 2023-10-02 NOTE — ED Notes (Addendum)
 Pt came out of room in a manic states very hyper-verbal and emotional states I'm your father and you have hurt my feelings. No aggressive behavior noted but appears but Dale Calderon remains hyperverbal and religiously pre-occupied. 5 mg  dose of abilify  given po.

## 2023-10-02 NOTE — ED Notes (Signed)
 Kailen is very religiously pre-occupied and states his family is out to get him. He feels that their are many demons around him that are attempting to take his soul. He refused abilify  5 mg offered earlier in shift states it makes his head feel strange. He was then offered trazodone  50 mg at HS at which time he became enraged with angry stating trazodone  would bring out the demons that are in him and they would cause him to attack people. IM medication was considered  but once left alone he did not displaying any threatening behavior toward staff or self injurious but continued to refuse po medicatons.

## 2023-10-02 NOTE — ED Notes (Signed)
 Called sheriff to verify patient was still on the list. They stated they are working on picking patient up but do not know what time still.

## 2023-10-02 NOTE — ED Provider Notes (Signed)
 Emergency Medicine Observation Re-evaluation Note  Dale Calderon is a 43 y.o. male, seen on rounds today.  Pt initially presented to the ED for complaints of Psychiatric Evaluation (IVCd) Currently, the patient is resting  Physical Exam  BP (!) 147/101 (BP Location: Right Arm)   Pulse 88   Temp 98 F (36.7 C) (Oral)   Resp 18   SpO2 98%  Physical Exam General: nad Cardiac: rr Lungs: non labored Psych: calm   ED Course / MDM  EKG:   I have reviewed the labs performed to date as well as medications administered while in observation.  Recent changes in the last 24 hours include psych evaluated.  Plan  Current plan is for inpatient psych.    Neysa Caron PARAS, DO 10/02/23 1302

## 2023-10-02 NOTE — ED Notes (Signed)
 Patient off unit to facility per provider Patient alert, cooperative no s/s of distress at time of discharge. Discharge information and belongings given to [facility] for transport. Patient ambulatory off unit, escorted and transported by [facility].

## 2023-10-22 ENCOUNTER — Ambulatory Visit (INDEPENDENT_AMBULATORY_CARE_PROVIDER_SITE_OTHER): Payer: MEDICAID | Admitting: Physician Assistant

## 2023-10-22 ENCOUNTER — Encounter (HOSPITAL_COMMUNITY): Payer: Self-pay | Admitting: Physician Assistant

## 2023-10-22 VITALS — BP 121/76 | HR 74 | Temp 98.0°F | Ht 73.0 in | Wt 250.0 lb

## 2023-10-22 DIAGNOSIS — F250 Schizoaffective disorder, bipolar type: Secondary | ICD-10-CM | POA: Diagnosis not present

## 2023-10-22 DIAGNOSIS — F411 Generalized anxiety disorder: Secondary | ICD-10-CM | POA: Diagnosis not present

## 2023-10-22 DIAGNOSIS — Z79899 Other long term (current) drug therapy: Secondary | ICD-10-CM

## 2023-10-22 MED ORDER — ABILIFY MAINTENA 400 MG IM SRER: 400.0000 mg | INTRAMUSCULAR | 11 refills | Status: DC

## 2023-10-22 MED ORDER — LITHIUM CARBONATE 150 MG PO CAPS: ORAL_CAPSULE | ORAL | 0 refills | Status: AC

## 2023-10-22 NOTE — Progress Notes (Addendum)
 BH MD/PA/NP OP Progress Note  10/22/2023 2:57 PM FLOYED MASOUD  MRN:  [id number]  Chief Complaint:  Chief Complaint  Patient presents with   Follow-up   Medication Management   HPI:   Clifton Safley. Star is a 43 year old male with a past psychiatric history significant for schizoaffective disorder (bipolar type), attention deficit hyperactivity disorder, opiate use disorder (in sustained remission), and stimulant use disorder (in sustained remission) who presents to [facility], accompanied by his father Usher Hedberg), for follow up and medication management. Patient was last seen by Prentice Espy, MD on 05/31/2023. During his last encounter, patient was being managed on the following psychiatric medications: Abilify  Maintena 400 mg every 28 days.  Patient reports that he was recently discharged from [facility]. Patient reports he was admitted for having a spiritual moment and praising the lord. Prior to his admission, patient reports that he had said some things to his family members that prompted them to admit him. Patient is unable to recall what was said to his family members.  Per patient's discharge papers, patient was admitted to [facility] from 10/02/2023 to 10/10/2023. Patient was admitted due to schizophrenia, anxiety, delusions, and responding to internal stimuli with a fixation on religious elements. Patient was discharged on the following psychiatric medications:  Lithium  300 mg 2 times daily (morning and night) Ziprasidone  40 mg with supper  Since being discharged from the hospital, patient reports that his medications have been somewhat helpful; however, he reports that they make him drowsy. Patient also notes not having as much energy. Patient's father reports that the patient's use of his current medication is causing him to have hypertension. He also adds that someone has to remind the patient to take his medications regularly.  Patient's father states that due to patient's medicaid, patient qualifies for an ACT Team. Patient's father would like for the patient to be placed back on Abilify  Maintena since the medication was effective in managing his symptoms and the patient did not experience any adverse side effects with the injection.  Today, patient states that he feels fine but does endorse decreased energy. Patient's father states that the patient has been more quiet, unemotional, and not himself. Patient denies depression but does express having no hope or ambition.  Patient denies anxiety.  Patient further denies paranoia nor does he endorse auditory or visual hallucinations.  A PHQ-9 screen was performed with the patient scoring a 4.  A GAD-7 screen was also performed with the patient scoring a 5.  Patient is alert and oriented x 4, calm, cooperative, and fully engaged in conversation during the encounter.  Patient endorses neutral mood and expresses that he has no motivation.  Patient exhibits depressed mood with appropriate affect.  Patient denies suicidal or homicidal ideations.  He further denies auditory or visual hallucinations and does not appear to be responding to internal/external stimuli.  Patient endorses fair sleep and receives on average 6 to 7 hours of sleep per night.  Patient endorses good appetite and eats on average 3 meals per day.  Patient denies alcohol consumption.  Patient denies tobacco use but does endorse vaping.  Patient endorses illicit drug use stating that he last used marijuana a few days ago.  Visit Diagnosis:    ICD-10-CM   1. Generalized anxiety disorder  F41.1     2. Schizoaffective disorder, bipolar type (HCC)  F25.0 ARIPiprazole  ER (ABILIFY  MAINTENA) 400 MG SRER injection    lithium  carbonate  150 MG capsule    3. Long term current use of antipsychotic medication  Z79.899 CBC with Differential/Platelet    Lipid panel    Hemoglobin A1c    Comprehensive metabolic panel with GFR     Comprehensive metabolic panel with GFR    Hemoglobin A1c    Lipid panel    CBC with Differential/Platelet      Past Psychiatric History:  Diagnoses: Schizoaffective disorder, bipolar type, substance-induced psychosis, substance-induced mood disorder, cocaine abuse, opiate use disorder Previous psychiatrist/therapist: Niki Bach Hospitalizations: 1  Past Medical History:  Past Medical History:  Diagnosis Date   ADHD (attention deficit hyperactivity disorder)    History reviewed. No pertinent surgical history.  Family Psychiatric History:  Not applicable  Family History: History reviewed. No pertinent family history.  Social History:  Social History   Socioeconomic History   Marital status: Single    Spouse name: Not on file   Number of children: Not on file   Years of education: Not on file   Highest education level: Not on file  Occupational History   Not on file  Tobacco Use   Smoking status: Former    Types: Cigarettes   Smokeless tobacco: Never  Vaping Use   Vaping status: Every Day   Substances: Nicotine   Substance and Sexual Activity   Alcohol use: Yes    Comment: occ   Drug use: Yes    Types: Marijuana   Sexual activity: Not on file  Other Topics Concern   Not on file  Social History Narrative   ** Merged History Encounter **       Social Drivers of Health   Financial Resource Strain: Not on file  Food Insecurity: Low Risk  (08/14/2023)   Received from [facility]   Hunger Vital Sign    Within the past 12 months, you worried that your food would run out before you got money to buy more: Never true    Within the past 12 months, the food you bought just didn't last and you didn't have money to get more. : Never true  Transportation Needs: No Transportation Needs (08/14/2023)   Received from [facility]    In the past 12 months, has lack of reliable transportation kept you from medical appointments, meetings, work or from  getting things needed for daily living? : No  Physical Activity: Not on file  Stress: Not on file  Social Connections: Not on file    Allergies: No Known Allergies  Metabolic Disorder Labs: Lab Results  Component Value Date   HGBA1C 5.5 01/20/2021   No results found for: PROLACTIN Lab Results  Component Value Date   CHOL 145 01/20/2021   TRIG 102 01/20/2021   HDL 49 01/20/2021   CHOLHDL 3.0 01/20/2021   LDLCALC 77 01/20/2021   Lab Results  Component Value Date   TSH 7.030 (H) 01/20/2021    Therapeutic Level Labs: No results found for: LITHIUM  No results found for: VALPROATE No results found for: CBMZ  Current Medications: Current Outpatient Medications  Medication Sig Dispense Refill   lithium  carbonate 150 MG capsule Patient to take lithium  150 mg daily for 7 days before discontinuing. 7 capsule 0   albuterol  (PROVENTIL  HFA;VENTOLIN  HFA) 108 (90 Base) MCG/ACT inhaler Inhale 2 puffs into the lungs every 6 (six) hours as needed for wheezing or shortness of breath.     amphetamine-dextroamphetamine (ADDERALL) 20 MG tablet Take 20 mg by mouth. Take 1.5 tablets (30 mg  total) by mouth 2 times daily. May take another 1/2 tablet per day if needed. (Patient not taking: Reported on 10/01/2023)     ARIPiprazole  ER (ABILIFY  MAINTENA) 400 MG SRER injection Inject 2 mLs (400 mg total) into the muscle every 28 (twenty-eight) days. Max 1 injection per 28 days dispense brand name only 1 each 11   cyclobenzaprine  (FLEXERIL ) 10 MG tablet Take 1 tablet (10 mg total) by mouth 2 (two) times daily as needed for muscle spasms. (Patient not taking: Reported on 10/01/2023) 20 tablet 0   lidocaine  (LIDODERM ) 5 % Place 1 patch onto the skin daily. Remove & Discard patch within 12 hours or as directed by MD (Patient not taking: Reported on 10/01/2023) 15 patch 0   predniSONE  (DELTASONE ) 20 MG tablet 3 Tabs PO Days 1-3, then 2 tabs PO Days 4-6, then 1 tab PO Day 7-9, then Half Tab PO Day 10-12  (Patient not taking: Reported on 10/01/2023) 20 tablet 0   No current facility-administered medications for this visit.     Musculoskeletal: Strength & Muscle Tone: within normal limits Gait & Station: normal Patient leans: N/A  Psychiatric Specialty Exam: Review of Systems  Psychiatric/Behavioral:  Positive for dysphoric mood. Negative for decreased concentration, hallucinations, self-injury, sleep disturbance and suicidal ideas. The patient is not nervous/anxious and is not hyperactive.     Blood pressure 121/76, pulse 74, temperature 98 F (36.7 C), temperature source Oral, height 6' 1 (1.854 m), weight 250 lb (113.4 kg), SpO2 97%.Body mass index is 32.98 kg/m.  General Appearance: Casual  Eye Contact:  Good  Speech:  Clear and Coherent and Normal Rate  Volume:  Normal  Mood:  Anxious and Depressed  Affect:  Appropriate  Thought Process:  Coherent, Goal Directed, and Descriptions of Associations: Intact  Orientation:  Full (Time, Place, and Person)  Thought Content: WDL   Suicidal Thoughts:  No  Homicidal Thoughts:  No  Memory:  Immediate;   Good Recent;   Good Remote;   Good  Judgement:  Good  Insight:  Good  Psychomotor Activity:  Normal  Concentration:  Concentration: Good and Attention Span: Good  Recall:  Good  Fund of Knowledge: Good  Language: Good  Akathisia:  No  Handed:  Right  AIMS (if indicated): done; 0  Assets:  Communication Skills Desire for Improvement Housing Leisure Time Physical Health Social Support Talents/Skills  ADL's:  Intact  Cognition: WNL  Sleep:  Good   Screenings: AIMS    Flowsheet Row Clinical Support from 10/22/2023 in [facility] Most recent reading at 10/22/2023  1:38 PM Office Visit from 02/27/2023 in [facility] Most recent reading at 02/27/2023  1:17 PM Office Visit from 09/21/2022 in [facility] Most recent reading at 09/21/2022 11:51 AM  Clinical Support from 09/21/2022 in [facility] Most recent reading at 09/21/2022 11:47 AM Clinical Support from 06/05/2022 in [facility] Most recent reading at 06/05/2022  3:31 PM  AIMS Total Score 0 0 0 0 0   GAD-7    Flowsheet Row Clinical Support from 10/22/2023 in [facility] Most recent reading at 10/22/2023  1:37 PM Office Visit from 02/27/2023 in [facility] Most recent reading at 02/27/2023  1:16 PM Office Visit from 12/14/2022 in [facility] Most recent reading at 12/14/2022 11:55 AM Office Visit from 09/21/2022 in [facility] Most recent reading  at 09/21/2022 11:51 AM Clinical Support from 09/21/2022 in [facility] Most recent reading at 09/21/2022 11:48 AM  Total GAD-7 Score 5 2 4 9 9    PHQ2-9    Flowsheet Row Clinical Support from 10/22/2023 in [facility] Most recent reading at 10/22/2023  1:35 PM Office Visit from 02/27/2023 in [facility] Most recent reading at 02/27/2023  1:16 PM Office Visit from 12/14/2022 in [facility] Most recent reading at 12/14/2022 11:55 AM Office Visit from 09/21/2022 in [facility] Most recent reading at 09/21/2022 11:50 AM Clinical Support from 09/21/2022 in [facility] Most recent reading at 09/21/2022 11:49 AM  PHQ-2 Total Score 2 2 2 5 5   PHQ-9 Total Score 4 4 6 19 19    Flowsheet Row Clinical Support from 10/22/2023 in [facility] ED from 10/01/2023 in [facility] Emergency Department at [facility] Office Visit from 02/27/2023 in [facility]  C-SSRS RISK CATEGORY No Risk No Risk Error: Q3, 4, or 5 should not be populated when Q2 is No     Assessment and  Plan:   Aureliano A. 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 Patient was last seen by Prentice Espy, MD on 05/31/2023. During his last encounter, patient was being managed on the following psychiatric medications: Abilify  Maintena 400 mg every 28 days.  Patient was most recently discharged from [facility] on 10/10/2023.  Patient was admitted to [facility] due to schizophrenia, anxiety, delusions, and responding to internal stimuli with a fixation on religious elements. Patient was discharged on the following psychiatric medications:  Lithium  300 mg 2 times daily (morning and night) Ziprasidone  40 mg with supper  Since being discharged from the hospital, patient reports that the medications have been somewhat helpful; however, he reports that they make him feel drowsy.  Though patient denies overt depressive symptoms, he does report having no hope, ambition, or motivation.  A PHQ-9 screening was performed with the patient scoring a 4.  A GAD-7 screen was also performed the patient scoring a 5.  Patient denies any changes in his behavior since taking lithium  and ziprasidone .  Patient denies paranoia nor does he appear to be responding to internal/external stimuli.  Per patient's father, patient has to be constantly reminded to take his medications.  He also reports that since the patient has been on his medications, he is more quiet and is not himself.  He reports that patient's previous use of Abilify  Maintena was more effective in managing his symptoms.  Patient and his father both agreed for patient to be placed back on Abilify   Maintena.  Prior to being placed on Abilify  Maintena, provider informed patient that he would need to take Abilify  5 mg daily for at least 2 weeks.  Patient vocalized understanding.  Provider instructed for patient to taper off lithium  and ziprasidone  once he has started Abilify .  Provider instructed patient to take lithium  300 mg daily for 7 days, followed by 150 mg daily for 7 more days before discontinuing.  Provider also instructed patient to take ziprasidone  20 mg with supper for 7  days before discontinuing.  Patient vocalized understanding.  An aims assessment was performed with the patient scoring a 0.  Provider informed patient of the following blood work will need to be obtained: Hemoglobin A1c, complete blood count with differential, comprehensive metabolic panel, and lipid profile.  Patient verbalized understanding.  A [place] Suicide Severity Rating Scale was performed with the patient being considered no risk.  Patient denies suicidal ideations and is able to contract for safety at this time.  Safety planning was discussed with the patient prior to the conclusion of the encounter.  - Patient was instructed to contact 911 in the event of a mental health crisis. - Patient was instructed to contact [facility] in the event of a mental health crisis. - Patient was instructed to present to [facility] in the event of a mental health crisis.  Collaboration of Care: Collaboration of Care: Medication Management AEB provider managing patient's psychiatric medications, Primary Care Provider AEB patient being seen by a family medicine provider, Psychiatrist AEB patient being followed by a mental health provider at this facility, and Referral or follow-up with counselor/therapist AEB patient being seen by licensed clinical social worker at this facility.  Patient/Guardian was advised Release of Information must be obtained prior to any record  release in order to collaborate their care with an outside provider. Patient/Guardian was advised if they have not already done so to contact the registration department to sign all necessary forms in order for us  to release information regarding their care.   Consent: Patient/Guardian gives verbal consent for treatment and assignment of benefits for services provided during this visit. Patient/Guardian expressed understanding and agreed to proceed.   1. Schizoaffective disorder, bipolar type (HCC)  - ARIPiprazole  ER (ABILIFY  MAINTENA) 400 MG SRER injection; Inject 2 mLs (400 mg total) into the muscle every 28 (twenty-eight) days. Max 1 injection per 28 days dispense brand name only  Dispense: 1 each; Refill: 11 - lithium  carbonate 150 MG capsule; Patient to take lithium  150 mg daily for 7 days before discontinuing.  Dispense: 7 capsule; Refill: 0  2. Generalized anxiety disorder (Primary)  3. Long term current use of antipsychotic medication  - CBC with Differential/Platelet; Future - Lipid panel; Future - Hemoglobin A1c; Future - Comprehensive metabolic panel with GFR; Future  Patient to follow up in 6 weeks Patient has an appointment with [facility] scheduled for 11/07/2023 Provider spent a total of 42 minutes with the patient/reviewing the patient's chart  Reginia FORBES Bolster, PA 10/22/2023, 2:57 PM

## 2023-10-24 ENCOUNTER — Ambulatory Visit (HOSPITAL_COMMUNITY): Payer: MEDICAID

## 2023-10-24 NOTE — Progress Notes (Cosign Needed)
 Patient has been off injection since 05/31/23. Patient was given oral Abilify  10 mg for 14 day trial. Will return Oct 1st for first injection of Abilify  Maintena 400 mg.

## 2023-11-07 ENCOUNTER — Ambulatory Visit (INDEPENDENT_AMBULATORY_CARE_PROVIDER_SITE_OTHER): Payer: MEDICAID

## 2023-11-07 ENCOUNTER — Encounter (HOSPITAL_COMMUNITY): Payer: Self-pay

## 2023-11-07 VITALS — BP 136/89 | HR 99 | Resp 18 | Ht 73.0 in | Wt 248.0 lb

## 2023-11-07 DIAGNOSIS — F250 Schizoaffective disorder, bipolar type: Secondary | ICD-10-CM

## 2023-11-07 DIAGNOSIS — F200 Paranoid schizophrenia: Secondary | ICD-10-CM

## 2023-11-07 MED ORDER — ARIPIPRAZOLE ER 400 MG IM PRSY
400.0000 mg | PREFILLED_SYRINGE | INTRAMUSCULAR | Status: AC
  Administered 2023-11-07 – 2024-03-04 (×3): 400 mg via INTRAMUSCULAR

## 2023-11-07 NOTE — Progress Notes (Cosign Needed)
 Patient presented to office for injection of Abilify  Maintena 400 mg.  Prior to injection, patient verbalized completing the Oral Abilify  trial.Patient received injection in Right upper outer quad . Patient tolerated injection well. Patient denies SI/HI/AVH. Patient denies any concerns. Patient will return in 28 days.

## 2023-12-04 ENCOUNTER — Encounter (HOSPITAL_COMMUNITY): Payer: MEDICAID | Admitting: Physician Assistant

## 2023-12-05 ENCOUNTER — Ambulatory Visit (HOSPITAL_COMMUNITY): Payer: MEDICAID

## 2023-12-11 ENCOUNTER — Ambulatory Visit (HOSPITAL_COMMUNITY): Payer: MEDICAID | Admitting: Licensed Clinical Social Worker

## 2023-12-18 ENCOUNTER — Ambulatory Visit (HOSPITAL_COMMUNITY): Payer: MEDICAID

## 2023-12-20 ENCOUNTER — Ambulatory Visit (INDEPENDENT_AMBULATORY_CARE_PROVIDER_SITE_OTHER): Payer: MEDICAID

## 2023-12-20 VITALS — BP 140/89 | HR 80 | Ht 73.0 in | Wt 250.0 lb

## 2023-12-20 DIAGNOSIS — F250 Schizoaffective disorder, bipolar type: Secondary | ICD-10-CM | POA: Diagnosis not present

## 2023-12-20 NOTE — Progress Notes (Cosign Needed)
 Pt presents today for injection of abilify  Maintenna 400 mg administered in left upper outer quadrant. Pt tolerated injection with no complainants. Pt denies any AVH, SI, and HI. Pt states that his blood pressure was high today due to the mixture of coffee and his medicine adderall. Pt says he had a nice week walking with his father an playing video games that he enjoys.

## 2024-01-17 ENCOUNTER — Ambulatory Visit (HOSPITAL_COMMUNITY): Payer: MEDICAID

## 2024-01-17 ENCOUNTER — Encounter (HOSPITAL_COMMUNITY): Payer: Self-pay

## 2024-01-22 ENCOUNTER — Ambulatory Visit (HOSPITAL_COMMUNITY): Payer: MEDICAID

## 2024-02-19 ENCOUNTER — Encounter (HOSPITAL_COMMUNITY): Payer: Self-pay

## 2024-02-19 ENCOUNTER — Telehealth (HOSPITAL_COMMUNITY): Payer: Self-pay

## 2024-02-19 NOTE — Telephone Encounter (Signed)
 I called this patient on both #'s on file to get missed injection appointment rescheduled. Phone # ending RICHELLE is out of service. I was able to leave a HIPAA compliance VM on # ending in [phone] & I also sent a message via MyChart asking patient to call our office to get appointment rescheduled. Thanks.

## 2024-03-04 ENCOUNTER — Ambulatory Visit (INDEPENDENT_AMBULATORY_CARE_PROVIDER_SITE_OTHER): Payer: MEDICAID | Admitting: Psychiatry

## 2024-03-04 ENCOUNTER — Ambulatory Visit (INDEPENDENT_AMBULATORY_CARE_PROVIDER_SITE_OTHER): Payer: MEDICAID

## 2024-03-04 ENCOUNTER — Encounter (HOSPITAL_COMMUNITY): Payer: Self-pay

## 2024-03-04 VITALS — BP 150/103 | HR 99 | Ht 73.0 in | Wt 253.8 lb

## 2024-03-04 DIAGNOSIS — F250 Schizoaffective disorder, bipolar type: Secondary | ICD-10-CM | POA: Diagnosis not present

## 2024-03-04 MED ORDER — ABILIFY MAINTENA 400 MG IM SRER: 400.0000 mg | INTRAMUSCULAR | 11 refills | Status: AC

## 2024-03-04 NOTE — Progress Notes (Cosign Needed)
 Patient presented to office for injection of Abilify  400 mg. Patient has not received injection since 12/20/2023.  Prior to injection patient was seen virtually by provider. Patient blood pressure was elevated, Dr.Parsons made aware.   Patient received 5 mg of Abilify  Tablet per provider request and was sent home sample of Abilify  10 mg to take half tablet for the next 13 days. Patient was explained the instructions and expressed understanding. Patient received injection of Abilify  400 mg in RUOQ. Patient tolerated injection well. Patient weill return in 28 days. Patient left clinic alert and ambulatory.

## 2024-03-04 NOTE — Progress Notes (Signed)
 BH MD/PA/NP OP Progress Note Virtual Visit via Video Note  I connected with Dale Calderon on 03/04/24 at 10:00 AM EST by a video enabled telemedicine application and verified that I am speaking with the correct person using two identifiers.  Location: Patient: Home Provider: Clinic   I discussed the limitations of evaluation and management by telemedicine and the availability of in person appointments. The patient expressed understanding and agreed to proceed.  I provided 30 minutes of non-face-to-face time during this encounter.    03/04/2024 11:14 AM [name]  MRN:  [id number]  Chief Complaint: I have peace  HPI: 44 year old male seen today for follow-up psychiatric evaluation.  He has a psychiatric history of substance-induced mood disorder, psychosis, schizoaffective disorder bipolar type, ADHD, anxiety, polysubstance use, insomnia, and paranoid schizophrenia.  Currently he is managed on Abilify  400 mg monthly.  Patient is prescribed Adderall 15 mg twice daily by his PCP.  He informed clinical research associate that his medication is  effective for managing his psychiatric conditions.  Today he was well-groomed, pleasant, cooperative, and engaged in conversation.  He informed clinical research associate that he is at peace. He notes that e is trusting God and doing very well.  Patient notes that his mood stable and reports that his anxiety/depression is well managed. Today provider conducted a GAD-7 and patient scored a 3, at his last visit he scored a 4.  Provider also conducted PHQ-9 and patient scored a 1, at his last visit he scored a 2.  He endorses fair in sleep (4 hour) and appetite.  Today he denies SI/HI/VAH, mania, paranoia.  Patient reports that he has been having left shoulder pain.  He quantifies his pain as 6 out of 10.  He notes that has been going on for the last 3 days. Today patients blood pressure elevated at 150/103 . Provider encouraged patient to follow up with his PCP to discuss elevation. He notes  that he would call today.   Patient notes that he continues to smoke marijuana.  Provided with hand brief that marijuana can exacerbate his mental illness.  He endorsed understanding.  Patient has not had his Abilify  LAI in over 6 weeks.  Today patient restarted on Abilify  Maintena 400 mg.  He was also giving a sample of Abilify  10 mg and instructed to take half a pill for the next 14 days.  No medication other changes made today. Patient agreeable to continue medications as prescribed.  Potential side effects of medication and risks vs benefits of treatment vs non-treatment were explained and discussed. All questions were answered.No other concerns at this time. Visit Diagnosis:  No diagnosis found.     Past Psychiatric History: substance-induced mood disorder, psychosis, schizoaffective disorder bipolar type, ADHD, anxiety, polysubstance use, insomnia, and paranoid schizophrenia  Past Medical History:  Past Medical History:  Diagnosis Date   ADHD (attention deficit hyperactivity disorder)    No past surgical history on file.  Family Psychiatric History: None reported   Family History: No family history on file.  Social History:  Social History   Socioeconomic History   Marital status: Single    Spouse name: Not on file   Number of children: Not on file   Years of education: Not on file   Highest education level: Not on file  Occupational History   Not on file  Tobacco Use   Smoking status: Former    Types: Cigarettes   Smokeless tobacco: Never  Vaping Use   Vaping status: Every Day  Substances: Nicotine   Substance and Sexual Activity   Alcohol use: Yes    Comment: occ   Drug use: Yes    Types: Marijuana   Sexual activity: Not on file  Other Topics Concern   Not on file  Social History Narrative   ** Merged History Encounter **       Social Drivers of Health   Tobacco Use: Medium Risk (02/14/2024)   Received from [facility]   Patient History    Smoking  Tobacco Use: Former    Smokeless Tobacco Use: Never    Passive Exposure: Past  Physicist, Medical Strain: Not on file  Food Insecurity: Low Risk (08/14/2023)   Received from [facility]   Epic    Within the past 12 months, you worried that your food would run out before you got money to buy more: Never true    Within the past 12 months, the food you bought just didn't last and you didn't have money to get more. : Never true  Transportation Needs: No Transportation Needs (08/14/2023)   Received from [facility]    In the past 12 months, has lack of reliable transportation kept you from medical appointments, meetings, work or from getting things needed for daily living? : No  Physical Activity: Not on file  Stress: Not on file  Social Connections: Not on file  Depression (PHQ2-9): Low Risk (03/04/2024)   Depression (PHQ2-9)    PHQ-2 Score: 1  Alcohol Screen: Not on file  Housing: Low Risk (08/14/2023)   Received from [facility]   Epic    What is your living situation today?: I have a steady place to live    Think about the place you live. Do you have problems with any of the following? Choose all that apply:: None/None on this list  Utilities: Low Risk (08/14/2023)   Received from [facility]   Utilities    In the past 12 months has the electric, gas, oil, or water  company threatened to shut off services in your home? : No  Health Literacy: Not on file    Allergies: No Known Allergies  Metabolic Disorder Labs: Lab Results  Component Value Date   HGBA1C 5.5 01/20/2021   No results found for: PROLACTIN Lab Results  Component Value Date   CHOL 145 01/20/2021   TRIG 102 01/20/2021   HDL 49 01/20/2021   CHOLHDL 3.0 01/20/2021   LDLCALC 77 01/20/2021   Lab Results  Component Value Date   TSH 7.030 (H) 01/20/2021    Therapeutic Level Labs: No results found for: LITHIUM  No results found for: VALPROATE No results found for: CBMZ  Current  Medications: Current Outpatient Medications  Medication Sig Dispense Refill   albuterol  (PROVENTIL  HFA;VENTOLIN  HFA) 108 (90 Base) MCG/ACT inhaler Inhale 2 puffs into the lungs every 6 (six) hours as needed for wheezing or shortness of breath.     amphetamine-dextroamphetamine (ADDERALL) 20 MG tablet Take 20 mg by mouth. Take 1.5 tablets (30 mg total) by mouth 2 times daily. May take another 1/2 tablet per day if needed. (Patient not taking: Reported on 10/01/2023)     ARIPiprazole  ER (ABILIFY  MAINTENA) 400 MG SRER injection Inject 2 mLs (400 mg total) into the muscle every 28 (twenty-eight) days. Max 1 injection per 28 days dispense brand name only 1 each 11   cyclobenzaprine  (FLEXERIL ) 10 MG tablet Take 1 tablet (10 mg total) by mouth 2 (two) times daily as needed for muscle spasms. (  Patient not taking: Reported on 10/01/2023) 20 tablet 0   lidocaine  (LIDODERM ) 5 % Place 1 patch onto the skin daily. Remove & Discard patch within 12 hours or as directed by MD (Patient not taking: Reported on 10/01/2023) 15 patch 0   lithium  carbonate 150 MG capsule Patient to take lithium  150 mg daily for 7 days before discontinuing. 7 capsule 0   predniSONE  (DELTASONE ) 20 MG tablet 3 Tabs PO Days 1-3, then 2 tabs PO Days 4-6, then 1 tab PO Day 7-9, then Half Tab PO Day 10-12 (Patient not taking: Reported on 10/01/2023) 20 tablet 0   Current Facility-Administered Medications  Medication Dose Route Frequency Provider Last Rate Last Admin   ARIPiprazole  ER (ABILIFY  MAINTENA) 400 MG prefilled syringe 400 mg  400 mg Intramuscular Q28 days Ezzard Staci SAILOR, NP   400 mg at 12/20/23 1400     Musculoskeletal: Strength & Muscle Tone: within normal limits and telehealth visit Gait & Station: normal, telehealth visit Patient leans: N/A  Psychiatric Specialty Exam: Review of Systems  There were no vitals taken for this visit.There is no height or weight on file to calculate BMI.  General Appearance: Well Groomed  Eye  Contact:  Good  Speech:  Clear and Coherent and Normal Rate  Volume:  Normal  Mood:  Depressed  Affect:  Appropriate and Congruent  Thought Process:  Coherent, Goal Directed, and Linear  Orientation:  Full (Time, Place, and Person)  Thought Content: WDL and Logical   Suicidal Thoughts:  No  Homicidal Thoughts:  No  Memory:  Immediate;   Good Recent;   Good Remote;   Good  Judgement:  Good  Insight:  Good  Psychomotor Activity:  Normal  Concentration:  Concentration: Good and Attention Span: Good  Recall:  Good  Fund of Knowledge: Good  Language: Good  Akathisia:  No  Handed:  Right  AIMS (if indicated):not done  Assets:  Communication Skills Desire for Improvement Housing Leisure Time Physical Health Social Support Talents/Skills  ADL's:  Intact  Cognition: WNL  Sleep:  Good   Screenings: AIMS    Flowsheet Row Clinical Support from 10/22/2023 in [facility] Most recent reading at 10/22/2023  1:38 PM Office Visit from 02/27/2023 in [facility] Most recent reading at 02/27/2023  1:17 PM Office Visit from 09/21/2022 in [facility] Most recent reading at 09/21/2022 11:51 AM Clinical Support from 09/21/2022 in [facility] Most recent reading at 09/21/2022 11:47 AM Clinical Support from 06/05/2022 in [facility] Most recent reading at 06/05/2022  3:31 PM  AIMS Total Score 0 0 0 0 0   GAD-7    Flowsheet Row Office Visit from 03/04/2024 in [facility] Clinical Support from 10/22/2023 in [facility] Office Visit from 02/27/2023 in [facility] Office Visit from 12/14/2022 in [facility] Office Visit from 09/21/2022 in [facility]  Total GAD-7 Score 3 5 2 4 9    PHQ2-9    Flowsheet Row Office Visit from  03/04/2024 in [facility] Clinical Support from 10/22/2023 in [facility] Office Visit from 02/27/2023 in [facility] Office Visit from 12/14/2022 in [facility] Office Visit from 09/21/2022 in [facility]  PHQ-2 Total Score 0 2 2 2 5   PHQ-9 Total Score  1 4 4 6 19    Flowsheet Row Clinical Support from 10/22/2023 in [facility] ED from 10/01/2023 in [facility] Emergency Department at [facility] Office Visit from 02/27/2023 in [facility]  C-SSRS RISK CATEGORY No Risk No Risk Error: Q3, 4, or 5 should not be populated when Q2 is No     Assessment and Plan: Patient notes that he is doing well. Patient has not had his Abilify  LAI in over 6 weeks.  Today patient restarted on Abilify  Maintena 400 mg.  He was also giving a sample of Abilify  10 mg and instructed to take half a pill for the next 14 days.  No medication other changes made today. Patient agreeable to continue medications as prescribed.  Patient blood pressure elevated.  He will follow-up with his PCP to discuss this.  1. Schizoaffective disorder, bipolar type (HCC)  Continue- ARIPiprazole  ER (ABILIFY  MAINTENA) 400 MG SRER injection; Inject 2 mLs (400 mg total) into the muscle every 28 (twenty-eight) days. Max 1 injection per 28 days dispense brand name only  Dispense: 1 each; Refill: 11   Collaboration of Care: Collaboration of Care: Other provider involved in patient's care AEB PCP  Patient/Guardian was advised Release of Information must be obtained prior to any record release in order to collaborate their care with an outside provider. Patient/Guardian was advised if they have not already done so to contact the registration department to sign all necessary forms in order for us  to release information regarding their care.   Consent:  Patient/Guardian gives verbal consent for treatment and assignment of benefits for services provided during this visit. Patient/Guardian expressed understanding and agreed to proceed.   Follow up in 3 months for medication management Follow up with shot clinic in one month Zane FORBES Bach, NP 03/04/2024, 11:14 AM

## 2024-03-11 ENCOUNTER — Telehealth (HOSPITAL_COMMUNITY): Payer: Self-pay | Admitting: Psychiatry

## 2024-03-11 ENCOUNTER — Other Ambulatory Visit (HOSPITAL_COMMUNITY): Payer: Self-pay | Admitting: Psychiatry

## 2024-03-11 DIAGNOSIS — G4700 Insomnia, unspecified: Secondary | ICD-10-CM

## 2024-03-11 MED ORDER — TRAZODONE HCL 50 MG PO TABS: 50.0000 mg | ORAL_TABLET | Freq: Every day | ORAL | 3 refills | Status: AC

## 2024-03-11 NOTE — Telephone Encounter (Signed)
 Provider called patient and his speech was rapid.  He informed clinical research associate that last night he slept well but informed writer that prior nights he has only been sleeping 2 hours.  Provider spoke to patient's sisters who notes that patient has been more irritable, distracted, and has been responding to internal stimuli.  Patient denies current symptoms of psychosis.  Today patient agreeable to starting trazodone  50-100 mg nightly as needed to help manage sleep.  Provider informed patient and family that if psychosis worsens another antipsychotic can be added to help manage sleep and psychosis.

## 2024-03-11 NOTE — Telephone Encounter (Signed)
 Patient's sister reporting that the patient's injection is not working as well and he is having a reaction. Best number to call is [phone] (patient's sister, on [place], [place])

## 2024-04-01 ENCOUNTER — Ambulatory Visit (HOSPITAL_COMMUNITY): Payer: MEDICAID
